# Patient Record
Sex: Female | Born: 1975 | Hispanic: Refuse to answer | Smoking: Never smoker
Health system: Southern US, Community
[De-identification: ages and names within clinical notes are randomized; demographics above are authoritative.]

---

## 2005-12-17 ENCOUNTER — Emergency Department: Payer: Self-pay | Admitting: Emergency Medicine

## 2010-10-21 ENCOUNTER — Encounter: Payer: Self-pay | Admitting: Family Medicine

## 2010-10-21 ENCOUNTER — Ambulatory Visit: Payer: Self-pay | Admitting: Family Medicine

## 2010-10-21 ENCOUNTER — Ambulatory Visit (INDEPENDENT_AMBULATORY_CARE_PROVIDER_SITE_OTHER): Payer: BC Managed Care – PPO | Admitting: Family Medicine

## 2010-10-21 VITALS — BP 112/74 | HR 72 | Ht 60.75 in | Wt 172.0 lb

## 2010-10-21 DIAGNOSIS — Z309 Encounter for contraceptive management, unspecified: Secondary | ICD-10-CM

## 2010-10-21 NOTE — Progress Notes (Signed)
  Subjective:    Patient ID: Susan Bowers, female    DOB: Oct 08, 1975, 35 y.o.   MRN: [id number]  HPI    Review of Systems     Objective:   Physical Exam  Constitutional: She is oriented to person, place, and time. She appears well-developed and well-nourished.  Neck: Normal range of motion. Neck supple.  Cardiovascular: Normal rate, regular rhythm and normal heart sounds.   No murmur heard. Pulmonary/Chest: Effort normal and breath sounds normal.  Musculoskeletal: She exhibits no edema.  Neurological: She is alert and oriented to person, place, and time.  Psychiatric: She has a normal mood and affect. Her behavior is normal. Judgment and thought content normal.          Assessment & Plan:

## 2010-10-21 NOTE — Patient Instructions (Signed)

## 2010-10-21 NOTE — Progress Notes (Signed)
  Subjective:    Patient ID: Susan Bowers, female    DOB: April 07, 1976, 35 y.o.   MRN: [id number]  HPI Pt here to establish only.  No complaints.  Pt is under care of gyn in [place].     Review of Systems No complaints.    Objective:   Physical Exam  Constitutional: She appears well-developed and well-nourished.  Psychiatric: She has a normal mood and affect. Her behavior is normal. Judgment normal.          Assessment & Plan:

## 2010-10-21 NOTE — Assessment & Plan Note (Signed)
rto for fasting labs and cpe

## 2011-09-11 ENCOUNTER — Ambulatory Visit (INDEPENDENT_AMBULATORY_CARE_PROVIDER_SITE_OTHER): Payer: BC Managed Care – PPO | Admitting: Family Medicine

## 2011-09-11 ENCOUNTER — Encounter: Payer: Self-pay | Admitting: Family Medicine

## 2011-09-11 VITALS — BP 120/75 | HR 61 | Temp 98.8°F | Ht 61.5 in | Wt 177.6 lb

## 2011-09-11 DIAGNOSIS — J329 Chronic sinusitis, unspecified: Secondary | ICD-10-CM

## 2011-09-11 MED ORDER — AMOXICILLIN 875 MG PO TABS: 875.0000 mg | ORAL_TABLET | Freq: Two times a day (BID) | ORAL | Status: AC

## 2011-09-11 MED ORDER — BENZONATATE 200 MG PO CAPS: 200.0000 mg | ORAL_CAPSULE | Freq: Three times a day (TID) | ORAL | Status: AC | PRN

## 2011-09-11 NOTE — Progress Notes (Signed)
  Subjective:    Patient ID: Susan Bowers, female    DOB: 04-19-1976, 36 y.o.   MRN: [id number]  HPI URI- sxs started 6 days ago.  Initially thought it was allergies.  Started w/ cough, went to pharmacy on Thursday, recommended Advil Sinus.  Initially had fever- this is gone.  Still w/ nasal congestion, chest congestion, cough- now causing chest to hurt.  Intermittently productive.  Sputum is dark yellow.  Taking Mucinex.  Having facial pain.  No ear pain.  No known sick contacts.   Review of Systems For ROS see HPI     Objective:   Physical Exam  Vitals reviewed. Constitutional: She appears well-developed and well-nourished. No distress.  HENT:  Head: Normocephalic and atraumatic.  Right Ear: Tympanic membrane normal.  Left Ear: Tympanic membrane normal.  Nose: Mucosal edema and rhinorrhea present. Right sinus exhibits maxillary sinus tenderness. Right sinus exhibits no frontal sinus tenderness. Left sinus exhibits maxillary sinus tenderness. Left sinus exhibits no frontal sinus tenderness.  Mouth/Throat: Uvula is midline and mucous membranes are normal. Posterior oropharyngeal erythema present. No oropharyngeal exudate.  Eyes: Conjunctivae and EOM are normal. Pupils are equal, round, and reactive to light.  Neck: Normal range of motion. Neck supple.  Cardiovascular: Normal rate, regular rhythm and normal heart sounds.   Pulmonary/Chest: Effort normal and breath sounds normal. No respiratory distress. She has no wheezes.  Lymphadenopathy:    She has no cervical adenopathy.          Assessment & Plan:

## 2011-09-11 NOTE — Assessment & Plan Note (Signed)
New.  Pt's sxs and PE consistent w/ infxn.  Start abx.  Reviewed supportive care and red flags that should prompt return.  Pt expressed understanding and is in agreement w/ plan.  

## 2011-09-11 NOTE — Patient Instructions (Signed)
This is a sinus infection caused by allergy congestion Start the Amoxicillin twice daily (w/ food) Start Claritin or Zyrtec daily for the allergy component Use the Tessalon as needed for cough- you can combine w/ Delsym as needed Drink plenty of fluids REST! Hang in there!!!

## 2012-05-10 ENCOUNTER — Encounter: Payer: Self-pay | Admitting: Family Medicine

## 2012-05-10 ENCOUNTER — Ambulatory Visit (INDEPENDENT_AMBULATORY_CARE_PROVIDER_SITE_OTHER): Payer: BC Managed Care – PPO | Admitting: Family Medicine

## 2012-05-10 VITALS — BP 114/68 | HR 67 | Temp 98.6°F | Wt 184.2 lb

## 2012-05-10 DIAGNOSIS — L723 Sebaceous cyst: Secondary | ICD-10-CM

## 2012-05-10 DIAGNOSIS — L729 Follicular cyst of the skin and subcutaneous tissue, unspecified: Secondary | ICD-10-CM

## 2012-05-10 NOTE — Progress Notes (Signed)
  Subjective:    Patient ID: Susan Bowers, female    DOB: May 19, 1976, 36 y.o.   MRN: [id number]  HPI Pt here c/o cyst on her scalp that has been there for years.  It has recently gotten bigger and is tender.     Review of Systems    as above Objective:   Physical Exam BP 114/68  Pulse 67  Temp 98.6 F (37 C) (Oral)  Wt 184 lb 3.2 oz (83.553 kg)  SpO2 97% General appearance: alert, cooperative and no distress Head: atraumatic, + 3/4 inch cyst palpated on scalp, tender to touch, no signs of infection,  No drainage       Assessment & Plan:  seb cyst scalp-- refer to surgery secondary to length of time cyst has been present

## 2012-06-10 ENCOUNTER — Encounter (INDEPENDENT_AMBULATORY_CARE_PROVIDER_SITE_OTHER): Payer: Self-pay | Admitting: Surgery

## 2012-06-10 ENCOUNTER — Ambulatory Visit (INDEPENDENT_AMBULATORY_CARE_PROVIDER_SITE_OTHER): Payer: 59 | Admitting: Surgery

## 2012-06-10 VITALS — BP 110/72 | HR 60 | Temp 97.3°F | Resp 14 | Ht 61.0 in | Wt 181.4 lb

## 2012-06-10 DIAGNOSIS — L723 Sebaceous cyst: Secondary | ICD-10-CM

## 2012-06-10 DIAGNOSIS — L720 Epidermal cyst: Secondary | ICD-10-CM

## 2012-06-10 NOTE — Progress Notes (Signed)
Patient ID: Susan Bowers, female   DOB: 07-10-1975, 37 y.o.   MRN: [id number]  No chief complaint on file.   HPI Susan Bowers is a 37 y.o. female.  Patient sent at the request of Dr.Lowe for cyst on scalp. It has been present for many years. It is not draining. It is sore. It has increased in size minimally. HPI  No past medical history on file.  No past surgical history on file.  Family History  Problem Relation Age of Onset  . Alcohol abuse Father   . Liver disease Father   . Diabetes Maternal Aunt     Social History History  Substance Use Topics  . Smoking status: Never Smoker   . Smokeless tobacco: Not on file  . Alcohol Use: 0.5 oz/week    1 drink(s) per week    No Known Allergies  Current Outpatient Prescriptions  Medication Sig Dispense Refill  . Norgestim-Eth Estrad Triphasic (ORTHO TRI-CYCLEN, 28, PO) Take by mouth daily.          Review of Systems Review of Systems  Constitutional: Negative for fever, chills and unexpected weight change.  HENT: Negative for hearing loss, congestion, sore throat, trouble swallowing and voice change.   Eyes: Negative for visual disturbance.  Respiratory: Negative for cough and wheezing.   Cardiovascular: Negative for chest pain, palpitations and leg swelling.  Gastrointestinal: Negative for nausea, vomiting, abdominal pain, diarrhea, constipation, blood in stool, abdominal distention and anal bleeding.  Genitourinary: Negative for hematuria, vaginal bleeding and difficulty urinating.  Musculoskeletal: Negative for arthralgias.  Skin: Negative for rash and wound.  Neurological: Negative for seizures, syncope and headaches.  Hematological: Negative for adenopathy. Does not bruise/bleed easily.  Psychiatric/Behavioral: Negative for confusion.    Blood pressure 110/72, pulse 60, temperature 97.3 F (36.3 C), resp. rate 14, height 5\' 1"  (1.549 m), weight 181 lb 6.4 oz (82.283 kg).  Physical Exam Physical Exam    Constitutional: She is oriented to person, place, and time. She appears well-developed and well-nourished.  HENT:  Head: Normocephalic and atraumatic.       1 x 2 cm cyst scalp.  Not infected or draining  Musculoskeletal: Normal range of motion.  Neurological: She is alert and oriented to person, place, and time.  Skin: Skin is warm and dry.  Psychiatric: She has a normal mood and affect. Her behavior is normal. Judgment and thought content normal.      Assessment    Epidermal inclusion cyst scalp 1 x 2 cm    Plan    Discussed excision versus observation. Risks, benefits and alternative therapies discussed. She has chosen observation for now but will return if he becomes more symptomatic or infected.       Jw Covin A. 06/10/2012, 9:31 AM

## 2012-06-10 NOTE — Patient Instructions (Signed)

## 2012-06-20 ENCOUNTER — Other Ambulatory Visit: Payer: Self-pay | Admitting: Obstetrics and Gynecology

## 2012-06-20 DIAGNOSIS — Z1231 Encounter for screening mammogram for malignant neoplasm of breast: Secondary | ICD-10-CM

## 2012-07-25 ENCOUNTER — Ambulatory Visit: Payer: BC Managed Care – PPO

## 2012-09-30 ENCOUNTER — Encounter: Payer: Self-pay | Admitting: Family Medicine

## 2012-09-30 ENCOUNTER — Ambulatory Visit (INDEPENDENT_AMBULATORY_CARE_PROVIDER_SITE_OTHER): Payer: BC Managed Care – PPO | Admitting: Family Medicine

## 2012-09-30 VITALS — BP 92/76 | HR 89 | Temp 99.7°F | Wt 182.4 lb

## 2012-09-30 DIAGNOSIS — N390 Urinary tract infection, site not specified: Secondary | ICD-10-CM

## 2012-09-30 LAB — POCT URINALYSIS DIPSTICK
Glucose, UA: NEGATIVE
Ketones, UA: NEGATIVE
Spec Grav, UA: 1.02

## 2012-09-30 MED ORDER — CIPROFLOXACIN HCL 250 MG PO TABS: 250.0000 mg | ORAL_TABLET | Freq: Two times a day (BID) | ORAL | Status: DC

## 2012-09-30 NOTE — Patient Instructions (Signed)
Urinary Tract Infection Urinary tract infections (UTIs) can develop anywhere along your urinary tract. Your urinary tract is your body's drainage system for removing wastes and extra water. Your urinary tract includes two kidneys, two ureters, a bladder, and a urethra. Your kidneys are a pair of bean-shaped organs. Each kidney is about the size of your fist. They are located below your ribs, one on each side of your spine. CAUSES Infections are caused by microbes, which are microscopic organisms, including fungi, viruses, and bacteria. These organisms are so small that they can only be seen through a microscope. Bacteria are the microbes that most commonly cause UTIs. SYMPTOMS  Symptoms of UTIs may vary by age and gender of the patient and by the location of the infection. Symptoms in young women typically include a frequent and intense urge to urinate and a painful, burning feeling in the bladder or urethra during urination. Older women and men are more likely to be tired, shaky, and weak and have muscle aches and abdominal pain. A fever may mean the infection is in your kidneys. Other symptoms of a kidney infection include pain in your back or sides below the ribs, nausea, and vomiting. DIAGNOSIS To diagnose a UTI, your caregiver will ask you about your symptoms. Your caregiver also will ask to provide a urine sample. The urine sample will be tested for bacteria and white blood cells. White blood cells are made by your body to help fight infection. TREATMENT  Typically, UTIs can be treated with medication. Because most UTIs are caused by a bacterial infection, they usually can be treated with the use of antibiotics. The choice of antibiotic and length of treatment depend on your symptoms and the type of bacteria causing your infection. HOME CARE INSTRUCTIONS  If you were prescribed antibiotics, take them exactly as your caregiver instructs you. Finish the medication even if you feel better after you  have only taken some of the medication.  Drink enough water and fluids to keep your urine clear or pale yellow.  Avoid caffeine, tea, and carbonated beverages. They tend to irritate your bladder.  Empty your bladder often. Avoid holding urine for long periods of time.  Empty your bladder before and after sexual intercourse.  After a bowel movement, women should cleanse from front to back. Use each tissue only once. SEEK MEDICAL CARE IF:   You have back pain.  You develop a fever.  Your symptoms do not begin to resolve within 3 days. SEEK IMMEDIATE MEDICAL CARE IF:   You have severe back pain or lower abdominal pain.  You develop chills.  You have nausea or vomiting.  You have continued burning or discomfort with urination. MAKE SURE YOU:   Understand these instructions.  Will watch your condition.  Will get help right away if you are not doing well or get worse. Document Released: 02/22/2005 Document Revised: 11/14/2011 Document Reviewed: 06/23/2011 Exit[employer] Patient Information 2013 [employer].  

## 2012-09-30 NOTE — Progress Notes (Signed)
  Subjective:    Susan Bowers is a 37 y.o. female who complains of right flank pain. She has had symptoms for 4 days. Patient also complains of back pain. Patient denies congestion, cough, fever, headache, rhinitis, sorethroat, stomach ache and vaginal discharge. Patient does not have a history of recurrent UTI. Patient does not have a history of pyelonephritis.   The following portions of the patient's history were reviewed and updated as appropriate: allergies, current medications, past family history, past medical history, past social history, past surgical history and problem list.  Review of Systems Pertinent items are noted in HPI.    Objective:    BP 92/76  Pulse 89  Temp(Src) 99.7 F (37.6 C) (Oral)  Wt 182 lb 6.4 oz (82.736 kg)  BMI 34.48 kg/m2  SpO2 99% General appearance: alert, cooperative, appears stated age and no distress Abdomen: soft, non-tender; bowel sounds normal; no masses,  no organomegaly Back --no flank pain  Laboratory:  Urine dipstick: mod for hemoglobin and mod for leukocyte esterase.   Micro exam: not done.    Assessment:    Acute cystitis and UTI     Plan:    Medications: ciprofloxacin. Maintain adequate hydration. Follow up if symptoms not improving, and as needed.

## 2012-10-02 LAB — URINE CULTURE: Colony Count: >=100000

## 2012-10-03 ENCOUNTER — Telehealth: Payer: Self-pay | Admitting: Family Medicine

## 2012-10-03 NOTE — Telephone Encounter (Signed)
Pt called and would like to hear about her labs that she took, pls call pt

## 2012-10-03 NOTE — Telephone Encounter (Signed)
Discuss with patient   Notes Recorded by Lelon Perla, DO on 10/02/2012 at 4:47 PM + UTI-- Treated with cipro

## 2014-12-08 ENCOUNTER — Other Ambulatory Visit: Payer: Self-pay

## 2014-12-08 DIAGNOSIS — Z1231 Encounter for screening mammogram for malignant neoplasm of breast: Secondary | ICD-10-CM

## 2014-12-09 ENCOUNTER — Ambulatory Visit
Admission: RE | Admit: 2014-12-09 | Discharge: 2014-12-09 | Disposition: A | Discharge status: Home / Self Care | Payer: 59 | Source: Ambulatory Visit

## 2014-12-09 DIAGNOSIS — Z1231 Encounter for screening mammogram for malignant neoplasm of breast: Secondary | ICD-10-CM

## 2015-03-11 ENCOUNTER — Ambulatory Visit (INDEPENDENT_AMBULATORY_CARE_PROVIDER_SITE_OTHER): Payer: 59 | Admitting: Family Medicine

## 2015-03-11 ENCOUNTER — Encounter (INDEPENDENT_AMBULATORY_CARE_PROVIDER_SITE_OTHER): Payer: Self-pay

## 2015-03-11 ENCOUNTER — Encounter: Payer: Self-pay | Admitting: Family Medicine

## 2015-03-11 VITALS — BP 114/72 | HR 73 | Temp 98.0°F | Wt 188.6 lb

## 2015-03-11 DIAGNOSIS — R350 Frequency of micturition: Secondary | ICD-10-CM | POA: Diagnosis not present

## 2015-03-11 LAB — POCT URINALYSIS DIPSTICK
Bilirubin, UA: NEGATIVE
Glucose, UA: NEGATIVE
Ketones, UA: NEGATIVE
NITRITE UA: NEGATIVE
PH UA: 6
Spec Grav, UA: 1.025
UROBILINOGEN UA: 1

## 2015-03-11 MED ORDER — CIPROFLOXACIN HCL 250 MG PO TABS: 250.0000 mg | ORAL_TABLET | Freq: Two times a day (BID) | ORAL | Status: DC

## 2015-03-11 NOTE — Patient Instructions (Signed)

## 2015-03-11 NOTE — Progress Notes (Signed)
Pre visit review using our clinic review tool, if applicable. No additional management support is needed unless otherwise documented below in the visit note. 

## 2015-03-11 NOTE — Progress Notes (Signed)
  ZOX:[id number]:Susan Lowne, DO Chief Complaint  Patient presents with  . Nocturia    c/o urine frequency x's 6 days  . Gas    x's 2 weeks    Current Issues:  Presents with few  days of dysuria, urinary urgency and urinary frequency Associated symptoms include:  dysuria and urinary frequency  There is no history of of similar symptoms. Sexually active:  Yes with female.   No concern for STI.  Prior to Admission medications   Medication Sig Start Date End Date Taking? Authorizing Provider  ORTHO TRI-CYCLEN, 28, 0.18/0.215/0.25 MG-35 MCG tablet Take 1 tablet by mouth daily. 02/27/15  Yes Historical Provider, MD  ciprofloxacin (CIPRO) 250 MG tablet Take 1 tablet (250 mg total) by mouth 2 (two) times daily. 03/11/15   [name], DO    Review of Systems:see above  PE:  BP 114/72 mmHg  Pulse 73  Temp(Src) 98 F (36.7 C) (Oral)  Wt 188 lb 9.6 oz (85.548 kg)  SpO2 99%  LMP 02/24/2015 Constitutional--no fever/ chills Heart--s1s2 Lungs-- ctab/l -- no rrw Back- no flank pain Abdomen--- soft non tender   Results for orders placed or performed in visit on 03/11/15  POCT Urinalysis Dipstick  Result Value Ref Range   Color, UA Yellow    Clarity, UA Cloudy    Glucose, UA Neg    Bilirubin, UA Neg    Ketones, UA Neg    Spec Grav, UA 1.025    Blood, UA Small    pH, UA 6.0    Protein, UA Moderate    Urobilinogen, UA 1.0    Nitrite, UA Neg    Leukocytes, UA moderate (2+) (A) Negative    Assessment and Plan:  1. Urine frequency Check culture Drink plenty of water Can use azo as directed cipro x 3 days - POCT Urinalysis Dipstick - Urine Culture - ciprofloxacin (CIPRO) 250 MG tablet; Take 1 tablet (250 mg total) by mouth 2 (two) times daily.  Dispense: 6 tablet; Refill: 0

## 2015-03-12 LAB — URINE CULTURE: Colony Count: 30000

## 2015-08-24 ENCOUNTER — Telehealth: Payer: Self-pay | Admitting: Family Medicine

## 2015-08-24 NOTE — Telephone Encounter (Signed)
VM from pt 3/28 9:18am stating name, DOB, and ph # with no message - called pt no answer, VM not setup

## 2016-01-16 IMAGING — MG MM SCREEN MAMMOGRAM BILATERAL
4 series · 4 of 4 positions shown · non-contrast
Comparison: None.

CLINICAL DATA: Screening.

EXAM:
DIGITAL SCREENING BILATERAL MAMMOGRAM WITH CAD

[R CC]
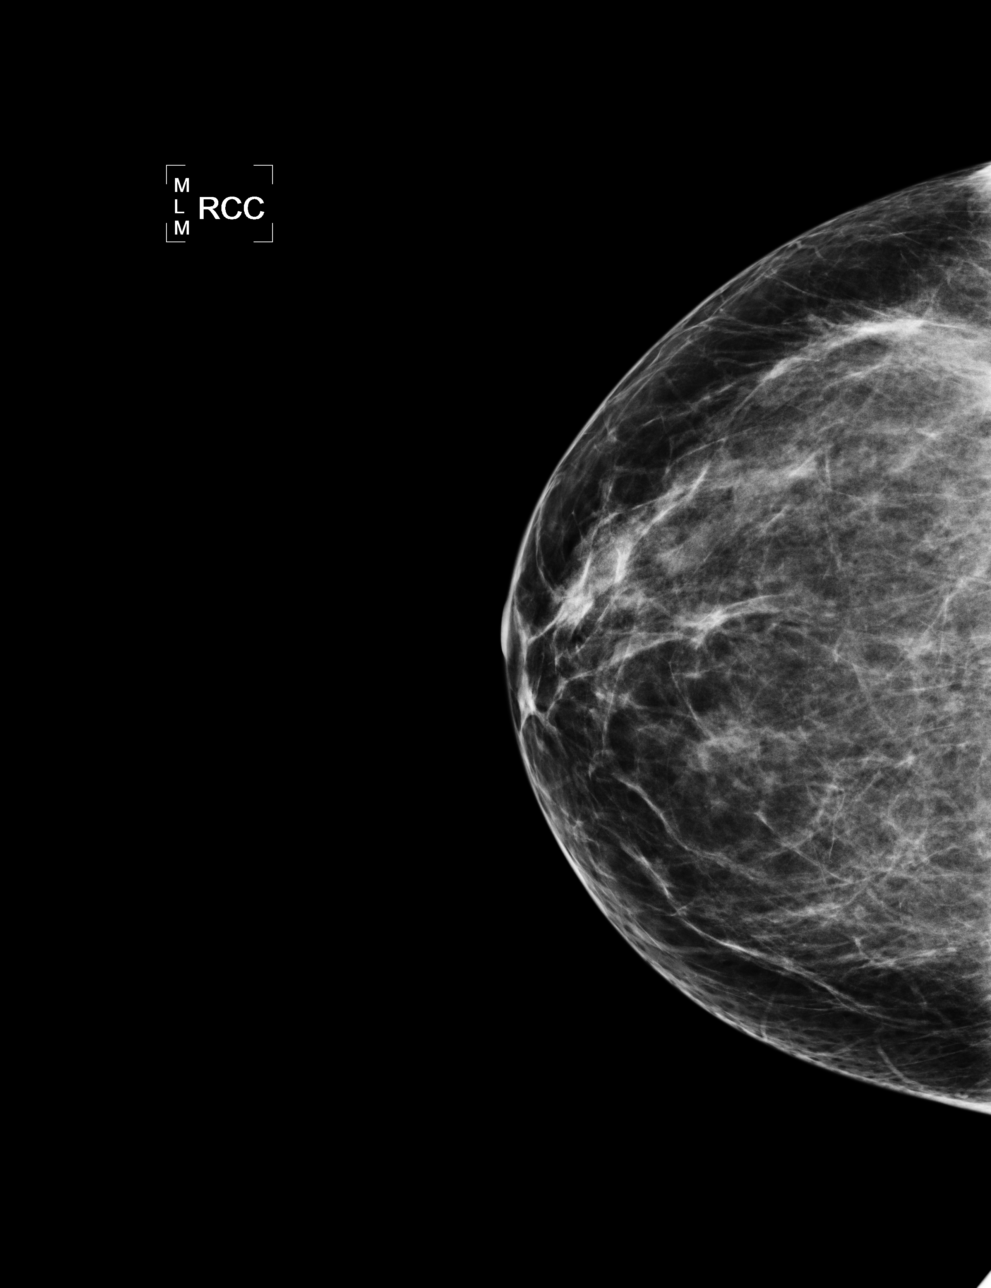

[L CC]
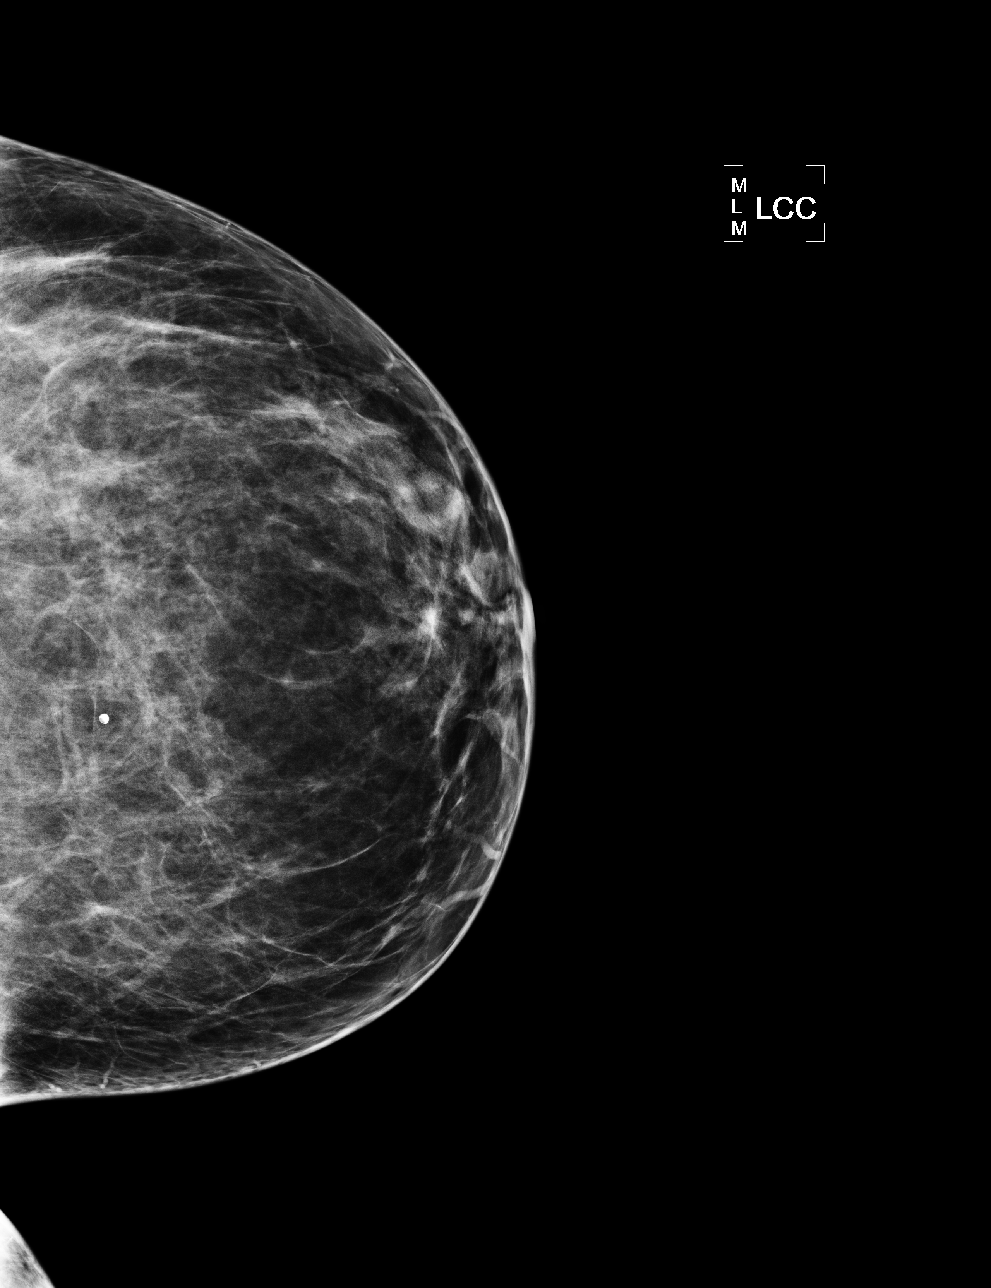

[L MLO]
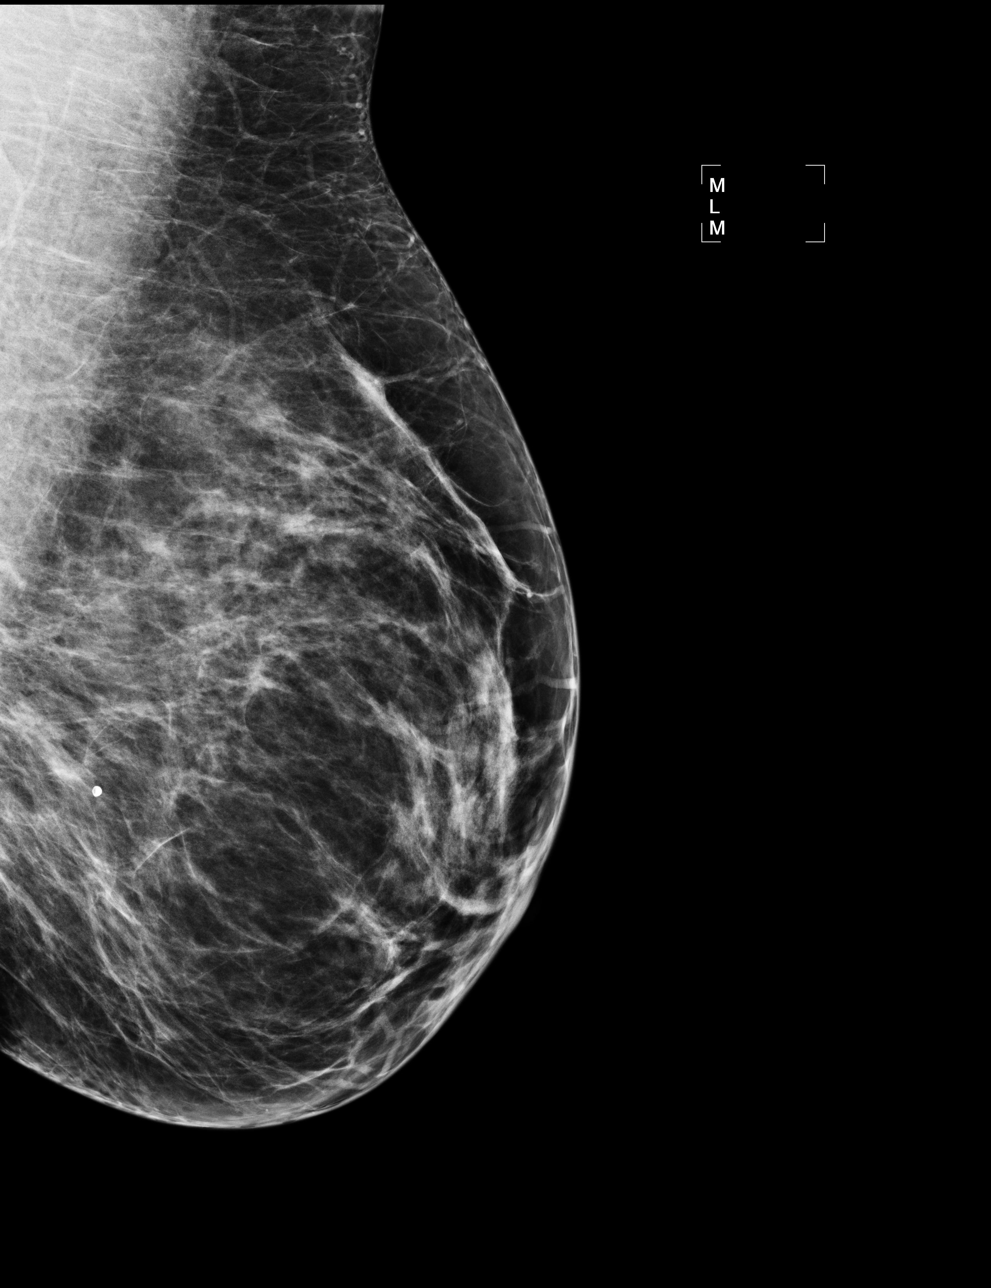

[R MLO]
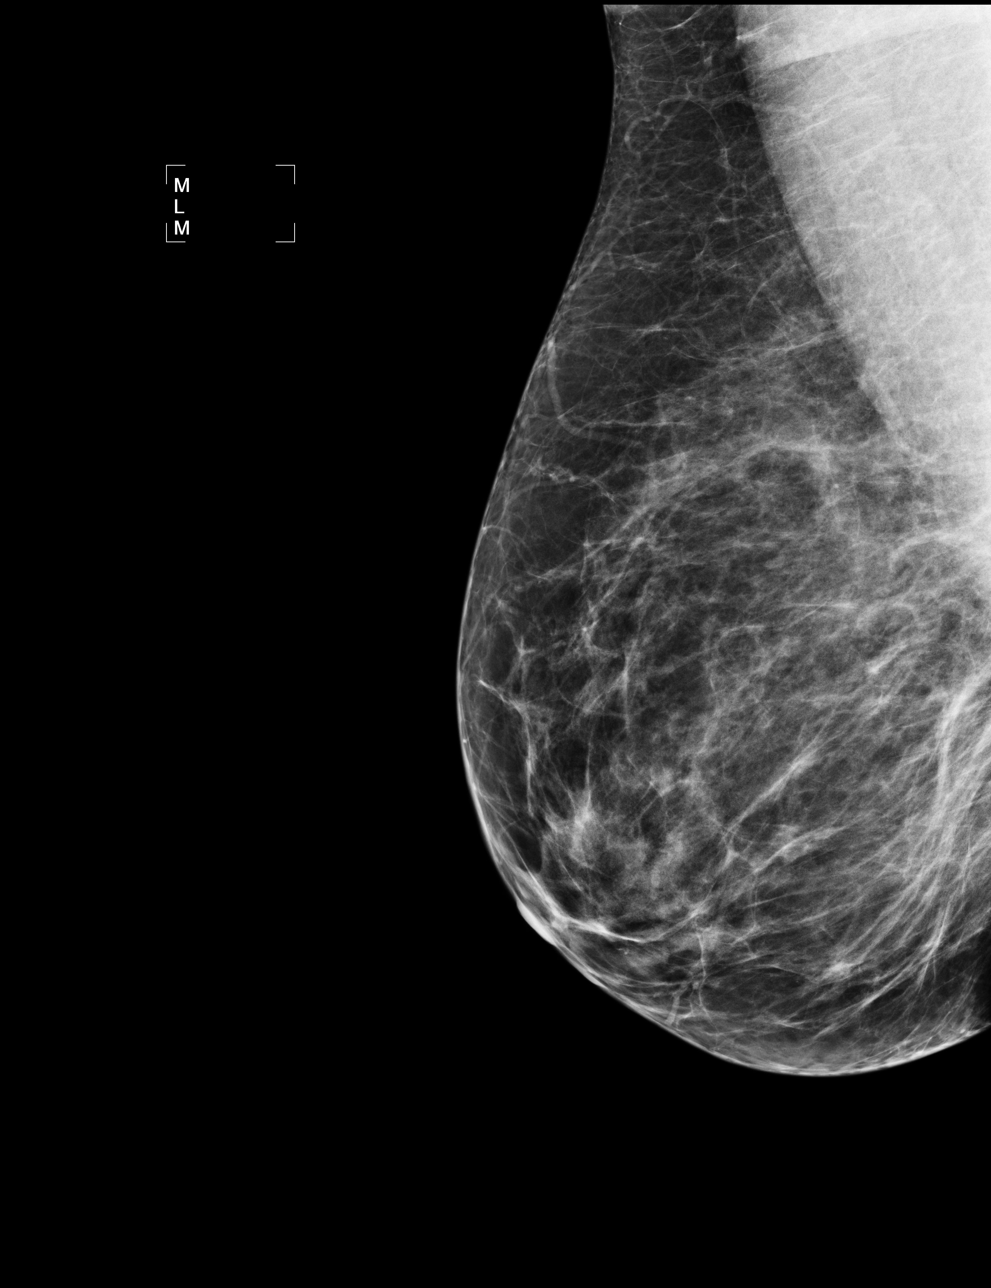

[4 of 4 positions shown; findings below may reference images not displayed]

ACR Breast Density Category b: There are scattered areas of
fibroglandular density.
FINDINGS: There are no findings suspicious for malignancy. Images were
processed with CAD.
IMPRESSION: No mammographic evidence of malignancy. A result letter of this
screening mammogram will be mailed directly to the patient.

RECOMMENDATION:
Screening mammogram in one year. (Code:[id number]-[id number])

BI-RADS CATEGORY  1: Negative.

## 2016-12-13 DIAGNOSIS — Z6839 Body mass index (BMI) 39.0-39.9, adult: Secondary | ICD-10-CM | POA: Diagnosis not present

## 2016-12-13 DIAGNOSIS — Z30011 Encounter for initial prescription of contraceptive pills: Secondary | ICD-10-CM | POA: Diagnosis not present

## 2016-12-13 DIAGNOSIS — E669 Obesity, unspecified: Secondary | ICD-10-CM | POA: Diagnosis not present

## 2016-12-13 DIAGNOSIS — Z30432 Encounter for removal of intrauterine contraceptive device: Secondary | ICD-10-CM | POA: Diagnosis not present

## 2017-05-17 ENCOUNTER — Ambulatory Visit: Payer: BLUE CROSS/BLUE SHIELD | Admitting: Family Medicine

## 2017-05-17 ENCOUNTER — Encounter: Payer: Self-pay | Admitting: Family Medicine

## 2017-05-17 VITALS — BP 120/60 | HR 92 | Temp 98.9°F | Resp 16 | Ht 61.0 in | Wt 199.8 lb

## 2017-05-17 DIAGNOSIS — L02415 Cutaneous abscess of right lower limb: Secondary | ICD-10-CM | POA: Diagnosis not present

## 2017-05-17 MED ORDER — AMOXICILLIN-POT CLAVULANATE 875-125 MG PO TABS: 1.0000 | ORAL_TABLET | Freq: Two times a day (BID) | ORAL | 0 refills | Status: DC

## 2017-05-17 MED ORDER — FLUCONAZOLE 150 MG PO TABS
150.0000 mg | ORAL_TABLET | Freq: Once | ORAL | 0 refills | Status: AC
Start: 2017-05-17 — End: 2017-05-17

## 2017-05-17 MED ORDER — CEFTRIAXONE SODIUM 1 G IJ SOLR
1.0000 g | Freq: Once | INTRAMUSCULAR | Status: AC
  Administered 2017-05-17: 1 g via INTRAMUSCULAR

## 2017-05-17 MED ORDER — TRAMADOL HCL 50 MG PO TABS: 50.0000 mg | ORAL_TABLET | Freq: Three times a day (TID) | ORAL | 0 refills | Status: DC | PRN

## 2017-05-17 MED FILL — AMOX-CLAV 875-125 MG TABLET: 875-125 | 10 days supply | Qty: 20 | Fill #0

## 2017-05-17 MED FILL — FLUCONAZOLE 150 MG TABLET: 150 | 6 days supply | Qty: 2 | Fill #0

## 2017-05-17 MED FILL — traMADol HCL 50 MG TABS: 50 | 7 days supply | Qty: 21 | Fill #0

## 2017-05-17 NOTE — Patient Instructions (Signed)

## 2017-05-17 NOTE — Progress Notes (Signed)
Patient ID: Susan Bowers, female   DOB: April 02, 1976, 41 y.o.   MRN: [id number]     Subjective:  I acted as a Education administrator for Dr. Carollee Herter.  Susan Bowers, Susan Bowers   Patient ID: Susan Bowers, female    DOB: 07-Sep-1975, 41 y.o.   MRN: [id number]  Chief Complaint  Patient presents with  . Edema    back of right thigh    HPI  Patient is in today for swelling back of right thigh.  Think she may have been bitten on Tuesday or Wednesday.  It started out with a little itch and now it is swollen.   She has used alcohol for the itching.  Patient Care Team: Carollee Herter, Alferd Apa, DO as PCP - General (Family Medicine)   No past medical history on file.  No past surgical history on file.  Family History  Problem Relation Age of Onset  . Alcohol abuse Father   . Liver disease Father   . Diabetes Maternal Aunt     Social History   Socioeconomic History  . Marital status: Divorced    Spouse name: Not on file  . Number of children: 1  . Years of education: Not on file  . Highest education level: Not on file  Social Needs  . Financial resource strain: Not on file  . Food insecurity - worry: Not on file  . Food insecurity - inability: Not on file  . Transportation needs - medical: Not on file  . Transportation needs - non-medical: Not on file  Occupational History  . 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 Smoking status: Never Smoker  . Smokeless tobacco: Never Used  Substance and Sexual Activity  . Alcohol use: Yes    Alcohol/week: 0.5 oz    Types: 1 Standard drinks or equivalent per week  . Drug use: Not on file  . Sexual activity: Not Currently    Partners: Male    Birth control/protection: Pill  Other Topics Concern  . Not on file  Social History Narrative  . Not on file    Outpatient Medications Prior to Visit  Medication Sig Dispense Refill  . ORTHO TRI-CYCLEN, 28, 0.18/0.215/0.25 MG-35 MCG tablet Take 1 tablet by mouth daily.  10    . ciprofloxacin (CIPRO) 250 MG tablet Take 1 tablet (250 mg total) by mouth 2 (two) times daily. 6 tablet 0   No facility-administered medications prior to visit.     No Known Allergies  Review of Systems  Constitutional: Negative for fever and malaise/fatigue.  HENT: Negative for congestion.   Eyes: Negative for blurred vision.  Respiratory: Negative for cough and shortness of breath.   Cardiovascular: Negative for chest pain, palpitations and leg swelling.  Gastrointestinal: Negative for vomiting.  Musculoskeletal: Negative for back pain.  Skin: Positive for itching (back of right thigh) and rash (back of right thigh).  Neurological: Negative for loss of consciousness and headaches.       Objective:    Physical Exam  Constitutional: She is oriented to person, place, and time. She appears well-developed and well-nourished.  HENT:  Head: Normocephalic and atraumatic.  Eyes: Conjunctivae and EOM are normal.  Neck: Normal range of motion. Neck supple. No JVD present. Carotid bruit is not present. No thyromegaly present.  Cardiovascular: Normal rate, regular rhythm and normal heart sounds.  No murmur heard. Pulmonary/Chest: Effort normal and breath sounds normal. No respiratory distress. She has no wheezes. She has no rales. She  exhibits no tenderness.  Musculoskeletal: She exhibits no edema.  Neurological: She is alert and oriented to person, place, and time.  Skin:     Psychiatric: She has a normal mood and affect.  Nursing note and vitals reviewed.   BP 120/60 (BP Location: Right Arm, Cuff Size: Large)   Pulse 92   Temp 98.9 F (37.2 C) (Oral)   Resp 16   Ht 5' 1" (1.549 m)   Wt 199 lb 12.8 oz (90.6 kg)   LMP 05/10/2017   SpO2 95%   BMI 37.75 kg/m  Wt Readings from Last 3 Encounters:  05/17/17 199 lb 12.8 oz (90.6 kg)  03/11/15 188 lb 9.6 oz (85.5 kg)  09/30/12 182 lb 6.4 oz (82.7 kg)   BP Readings from Last 3 Encounters:  05/17/17 120/60  03/11/15 114/72   09/30/12 92/76      There is no immunization history on file for this patient.  Health Maintenance  Topic Date Due  . HIV Screening  09/29/1989  . TETANUS/TDAP  09/29/1993  . INFLUENZA VACCINE  12/27/2016  . PAP SMEAR  04/28/2017    No results found for: WBC, HGB, HCT, PLT, GLUCOSE, CHOL, TRIG, HDL, LDLDIRECT, LDLCALC, ALT, AST, NA, K, CL, CREATININE, BUN, CO2, TSH, PSA, INR, GLUF, HGBA1C, MICROALBUR  No results found for: TSH No results found for: WBC, HGB, HCT, MCV, PLT No results found for: NA, K, CHLORIDE, CO2, GLUCOSE, BUN, CREATININE, BILITOT, ALKPHOS, AST, ALT, PROT, ALBUMIN, CALCIUM, ANIONGAP, EGFR, GFR No results found for: CHOL No results found for: HDL No results found for: LDLCALC No results found for: TRIG No results found for: CHOLHDL No results found for: HGBA1C       Assessment & Plan:   Problem List Items Addressed This Visit      Unprioritized   Abscess of right thigh - Primary    Rocephin IM Augmentin x 10 days epson salt soaks If no better or worsens will refer to surgery--- if over weekend -- go to ER      Relevant Medications   amoxicillin-clavulanate (AUGMENTIN) 875-125 MG tablet   traMADol (ULTRAM) 50 MG tablet   cefTRIAXone (ROCEPHIN) injection 1 g (Completed)      I have discontinued Susan Bowers's ciprofloxacin. I am also having her start on amoxicillin-clavulanate, fluconazole, and traMADol. Additionally, I am having her maintain her ORTHO TRI-CYCLEN (28). We administered cefTRIAXone.  Meds ordered this encounter  Medications  . amoxicillin-clavulanate (AUGMENTIN) 875-125 MG tablet    Sig: Take 1 tablet by mouth 2 (two) times daily.    Dispense:  20 tablet    Refill:  0  . fluconazole (DIFLUCAN) 150 MG tablet    Sig: Take 1 tablet (150 mg total) by mouth once for 1 dose. May repeat in 3 days prn    Dispense:  2 tablet    Refill:  0  . traMADol (ULTRAM) 50 MG tablet    Sig: Take 1 tablet (50 mg total) by mouth every 8  (eight) hours as needed.    Dispense:  30 tablet    Refill:  0  . cefTRIAXone (ROCEPHIN) injection 1 g    CMA served as scribe during this visit. History, Physical and Plan performed by medical provider. Documentation and orders reviewed and attested to.  Ann Held, DO

## 2017-05-17 NOTE — Assessment & Plan Note (Signed)
Rocephin IM Augmentin x 10 days epson salt soaks If no better or worsens will refer to surgery--- if over weekend -- go to ER

## 2017-05-18 ENCOUNTER — Encounter: Payer: Self-pay | Admitting: Family Medicine

## 2017-05-18 ENCOUNTER — Other Ambulatory Visit: Payer: Self-pay | Admitting: Family Medicine

## 2017-05-18 ENCOUNTER — Telehealth: Payer: Self-pay | Admitting: Family Medicine

## 2017-05-18 ENCOUNTER — Other Ambulatory Visit: Payer: Self-pay

## 2017-05-18 DIAGNOSIS — L0291 Cutaneous abscess, unspecified: Secondary | ICD-10-CM

## 2017-05-18 DIAGNOSIS — L02415 Cutaneous abscess of right lower limb: Secondary | ICD-10-CM

## 2017-05-18 NOTE — Telephone Encounter (Signed)
CRM for notification. See Telephone encounter for:   05/18/17.  Relation to WU:JWJXpt:self Call back number: [phone]   Reason for call:  Patient was seen 05/17/17 and states PCP advised if symptoms didn't improve to contact office and the next step would be a "abscess procedure" patient unaware of what type of procedure PCP was recommending, patient would like to discuss please advise

## 2017-05-18 NOTE — Telephone Encounter (Signed)
She needs asap app with [facility] advisers[facility]geon

## 2017-05-18 NOTE — Telephone Encounter (Signed)
Ref placed in system and Pt called

## 2017-06-04 ENCOUNTER — Telehealth: Payer: Self-pay | Admitting: *Deleted

## 2017-06-04 NOTE — Telephone Encounter (Signed)
Can you schedule this patient.  She has to be seen.  Not sure why [facility] did not schedule this.

## 2017-06-04 NOTE — Telephone Encounter (Signed)
Copied from CRM [id number]. Topic: Inquiry >> Jun 04, 2017  9:53 AM Raquel SarnaHayes, Teresa G wrote: Pt needs a school health questionnaire filled out by Dr. Laury AxonLowne and a TB skin test.   Please call pt back to let her know what she will needs to do to get this done.

## 2017-06-05 NOTE — Telephone Encounter (Signed)
Call patient and left message to call back and make an appointment per note below.

## 2017-06-18 ENCOUNTER — Encounter: Payer: Self-pay | Admitting: Family Medicine

## 2017-06-18 ENCOUNTER — Ambulatory Visit: Payer: BLUE CROSS/BLUE SHIELD | Admitting: Family Medicine

## 2017-06-18 VITALS — BP 110/70 | HR 86 | Temp 98.6°F | Resp 16 | Ht 61.0 in | Wt 202.0 lb

## 2017-06-18 DIAGNOSIS — Z23 Encounter for immunization: Secondary | ICD-10-CM | POA: Diagnosis not present

## 2017-06-18 MED ORDER — TETANUS-DIPHTH-ACELL PERTUSSIS 5-2.5-18.5 LF-MCG/0.5 IM SUSP
0.5000 mL | Freq: Once | INTRAMUSCULAR | Status: AC
  Administered 2017-06-18: 0.5 mL via INTRAMUSCULAR

## 2017-06-18 NOTE — Patient Instructions (Signed)
We will check titres for MMR and hep b in the blood today You received your tdap We will also do the TB gold test

## 2017-06-18 NOTE — Progress Notes (Signed)
Patient ID: Susan Bowers, female    DOB: 16-Feb-1976  Age: 42 y.o. 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   Review of Systems  Constitutional: Negative for appetite change, diaphoresis, fatigue and unexpected weight change.  Eyes: Negative for pain, redness and visual disturbance.  Respiratory: Negative for cough, chest tightness, shortness of breath and wheezing.   Cardiovascular: Negative for chest pain, palpitations and leg swelling.  Endocrine: Negative for cold intolerance, heat intolerance, polydipsia, polyphagia and polyuria.  Genitourinary: Negative for difficulty urinating, dysuria and frequency.  Neurological: Negative for dizziness, light-headedness, numbness and headaches.    History No past medical history on file.  She has no past surgical history on file.   Her family history includes Alcohol abuse in her father; Diabetes in her maternal aunt; Liver disease in her father.She reports that  has never smoked. she has never used smokeless tobacco. She reports that she drinks about 0.5 oz of alcohol per week. Her drug history is not on file.  Current Outpatient Medications on File Prior to Visit  Medication Sig Dispense Refill  . ORTHO TRI-CYCLEN, 28, 0.18/0.215/0.25 MG-35 MCG tablet Take 1 tablet by mouth daily.  10  . traMADol (ULTRAM) 50 MG tablet Take 1 tablet (50 mg total) by mouth every 8 (eight) hours as needed. 30 tablet 0   No current facility-administered medications on file prior to visit.      Objective:  Objective  Physical Exam  Constitutional: She is oriented to person, place, and time. She appears well-developed and well-nourished.  HENT:  Head: Normocephalic and atraumatic.  Eyes: Conjunctivae and EOM are normal.  Neck: Normal range of motion. Neck supple. No JVD present. Carotid bruit is not present. No thyromegaly present.  Cardiovascular: Normal  rate, regular rhythm and normal heart sounds.  No murmur heard. Pulmonary/Chest: Effort normal and breath sounds normal. No respiratory distress. She has no wheezes. She has no rales. She exhibits no tenderness.  Musculoskeletal: She exhibits no edema.  Neurological: She is alert and oriented to person, place, and time.  Psychiatric: She has a normal mood and affect.  Nursing note and vitals reviewed.  BP 110/70 (BP Location: Left Arm, Cuff Size: Normal)   Pulse 86   Temp 98.6 F (37 C) (Oral)   Resp 16   Ht 5' 1"  (1.549 m)   Wt 202 lb (91.6 kg)   SpO2 98%   BMI 38.17 kg/m  Wt Readings from Last 3 Encounters:  06/18/17 202 lb (91.6 kg)  05/17/17 199 lb 12.8 oz (90.6 kg)  03/11/15 188 lb 9.6 oz (85.5 kg)     No results found for: WBC, HGB, HCT, PLT, GLUCOSE, CHOL, TRIG, HDL, LDLDIRECT, LDLCALC, ALT, AST, NA, K, CL, CREATININE, BUN, CO2, TSH, PSA, INR, GLUF, HGBA1C, MICROALBUR  Mm Digital Screening Bilateral  Result Date: 12/09/2014 CLINICAL DATA:  Screening. EXAM: DIGITAL SCREENING BILATERAL MAMMOGRAM WITH CAD COMPARISON:  None. ACR Breast Density Category b: There are scattered areas of fibroglandular density. FINDINGS: There are no findings suspicious for malignancy. Images were processed with CAD. IMPRESSION: No mammographic evidence of malignancy. A result letter of this screening mammogram will be mailed directly to the patient. RECOMMENDATION: Screening mammogram in one year. (Code:SM-B-01Y) BI-RADS CATEGORY  1: Negative. Electronically Signed   By: Fidela Salisbury M.D.   On: 12/10/2014 12:08     Assessment & Plan:  Plan  I have  discontinued Heavin S. Hughson's amoxicillin-clavulanate. I am also having her maintain her ORTHO TRI-CYCLEN (28) and traMADol. We administered Tdap.  Meds ordered this encounter  Medications  . Tdap (BOOSTRIX) injection 0.5 mL    Problem List Items Addressed This Visit    None    Visit Diagnoses    Need for tuberculosis vaccination    -   Primary   Relevant Orders   QuantiFERON-TB Gold Plus   Need for MMR vaccine       Relevant Orders   Measles/Mumps/Rubella Immunity   Need for hepatitis B booster vaccination       Relevant Orders   Hepatitis B surface antibody   Need for Tdap vaccination       Relevant Medications   Tdap (BOOSTRIX) injection 0.5 mL (Completed)      Follow-up: Return if symptoms worsen or fail to improve.  Ann Held, DO

## 2017-06-20 LAB — QUANTIFERON-TB GOLD PLUS
MITOGEN-NIL: 7.88 [IU]/mL
NIL: 0.04 [IU]/mL
QUANTIFERON-TB GOLD PLUS: NEGATIVE
TB1-NIL: 0 [IU]/mL
TB2-NIL: 0.01 IU/mL

## 2017-06-20 LAB — MEASLES/MUMPS/RUBELLA IMMUNITY
Mumps IgG: 28.8 AU/mL
Rubella: 1.79 index
Rubeola IgG: 177 AU/mL

## 2017-06-20 LAB — HEPATITIS B SURFACE ANTIBODY, QUANTITATIVE: Hepatitis B-Post: <5 m[IU]/mL — ABNORMAL LOW (ref >=10)

## 2017-06-21 ENCOUNTER — Encounter: Payer: Self-pay | Admitting: *Deleted

## 2018-02-26 DIAGNOSIS — Z1239 Encounter for other screening for malignant neoplasm of breast: Secondary | ICD-10-CM | POA: Diagnosis not present

## 2018-02-26 DIAGNOSIS — Z01419 Encounter for gynecological examination (general) (routine) without abnormal findings: Secondary | ICD-10-CM | POA: Diagnosis not present

## 2018-02-26 DIAGNOSIS — N760 Acute vaginitis: Secondary | ICD-10-CM | POA: Diagnosis not present

## 2018-02-26 DIAGNOSIS — N852 Hypertrophy of uterus: Secondary | ICD-10-CM | POA: Diagnosis not present

## 2018-02-26 DIAGNOSIS — Z124 Encounter for screening for malignant neoplasm of cervix: Secondary | ICD-10-CM | POA: Diagnosis not present

## 2018-02-26 DIAGNOSIS — R87610 Atypical squamous cells of undetermined significance on cytologic smear of cervix (ASC-US): Secondary | ICD-10-CM | POA: Diagnosis not present

## 2018-02-26 DIAGNOSIS — N898 Other specified noninflammatory disorders of vagina: Secondary | ICD-10-CM | POA: Diagnosis not present

## 2019-01-17 ENCOUNTER — Ambulatory Visit (INDEPENDENT_AMBULATORY_CARE_PROVIDER_SITE_OTHER): Payer: 59 | Admitting: Family Medicine

## 2019-01-17 ENCOUNTER — Other Ambulatory Visit: Payer: Self-pay

## 2019-01-17 NOTE — Progress Notes (Signed)
Virtual Visit via Video Note  I connected with Susan Bowers on 01/17/19 at  3:20 PM EDT by a video enabled telemedicine application and verified that I am speaking with the correct person using two identifiers.  Location: Patient: home  Provider: offfice    I discussed the limitations of evaluation and management by telemedicine and the availability of in person appointments. The patient expressed understanding and agreed to proceed.  History of Present Illness: Pt is home c/o struggling with weight loss.   She is requesting a referral to a nutritionist.   No other complaints.   Observations/Objective: No vitals obtained Pt in NAD   Assessment and Plan: 1. Morbid obesity ([place]) Refer to healthy weight and wellness  - Amb Ref to Medical Weight Management  Follow Up Instructions:    I discussed the assessment and treatment plan with the patient. The patient was provided an opportunity to ask questions and all were answered. The patient agreed with the plan and demonstrated an understanding of the instructions.   The patient was advised to call back or seek an in-person evaluation if the symptoms worsen or if the condition fails to improve as anticipated.  I provided 15 minutes of non-face-to-face time during this encounter.   Ann Held, DO

## 2019-01-19 ENCOUNTER — Encounter: Payer: Self-pay | Admitting: Family Medicine

## 2019-01-23 ENCOUNTER — Encounter: Payer: Self-pay | Admitting: Family Medicine

## 2019-01-29 ENCOUNTER — Telehealth: Payer: Self-pay | Admitting: *Deleted

## 2019-01-29 NOTE — Telephone Encounter (Signed)
Heello, hope all is well.  I have not been contacted by the clinic i was referred to. Can you send a referral to Dr. Nancy Fetter at [facility] on [address]  Dr Sandi Mariscal [address], [place], [place] [address] Fax: [phone]  Thanks Susan Bowers

## 2019-01-29 NOTE — Telephone Encounter (Signed)
Patient sent this message through mychart.  She is talking about referral for wt loss clinic.  Can you send her referral here or do you need me to put in a new referral?

## 2019-04-07 ENCOUNTER — Ambulatory Visit (INDEPENDENT_AMBULATORY_CARE_PROVIDER_SITE_OTHER): Payer: No Typology Code available for payment source | Admitting: Family Medicine

## 2019-04-07 ENCOUNTER — Other Ambulatory Visit: Payer: Self-pay

## 2019-04-07 ENCOUNTER — Encounter: Payer: Self-pay | Admitting: Family Medicine

## 2019-04-07 VITALS — BP 110/60 | HR 75 | Temp 97.3°F | Resp 18 | Ht 61.0 in | Wt 185.8 lb

## 2019-04-07 DIAGNOSIS — Z23 Encounter for immunization: Secondary | ICD-10-CM | POA: Diagnosis not present

## 2019-04-07 DIAGNOSIS — M79604 Pain in right leg: Secondary | ICD-10-CM

## 2019-04-07 NOTE — Progress Notes (Signed)
Patient ID: Susan Bowers, female    DOB: 1975/09/11  Age: 43 y.o. MRN: [id number]    Subjective:  Subjective  HPI LAASIA ARCOS presents for R leg pain x years   It started with her knee and now its her entire leg .   No weakness per pt  No known back injury  Review of Systems  Constitutional: Negative for appetite change, diaphoresis, fatigue and unexpected weight change.  Eyes: Negative for pain, redness and visual disturbance.  Respiratory: Negative for cough, chest tightness, shortness of breath and wheezing.   Cardiovascular: Negative for chest pain, palpitations and leg swelling.  Endocrine: Negative for cold intolerance, heat intolerance, polydipsia, polyphagia and polyuria.  Genitourinary: Negative for difficulty urinating, dysuria and frequency.  Musculoskeletal: Positive for arthralgias and back pain.  Neurological: Negative for dizziness, light-headedness, numbness and headaches.    History No past medical history on file.  She has no past surgical history on file.   Her family history includes Alcohol abuse in her father; Diabetes in her maternal aunt; Liver disease in her father.She reports that she has never smoked. She has never used smokeless tobacco. She reports current alcohol use of about 1.0 standard drinks of alcohol per week. No history on file for drug.  Current Outpatient Medications on File Prior to Visit  Medication Sig Dispense Refill  . ORTHO TRI-CYCLEN, 28, 0.18/0.215/0.25 MG-35 MCG tablet Take 1 tablet by mouth daily.  10  . phentermine (ADIPEX-P) 37.5 MG tablet TK 1 T PO D    . traMADol (ULTRAM) 50 MG tablet Take 1 tablet (50 mg total) by mouth every 8 (eight) hours as needed. (Patient not taking: Reported on 04/07/2019) 30 tablet 0   No current facility-administered medications on file prior to visit.      Objective:  Objective  Physical Exam Vitals signs and nursing note reviewed.  Constitutional:      Appearance: She is well-developed.   HENT:     Head: Normocephalic and atraumatic.  Eyes:     Conjunctiva/sclera: Conjunctivae normal.  Neck:     Musculoskeletal: Normal range of motion and neck supple.     Thyroid: No thyromegaly.     Vascular: No carotid bruit or JVD.  Cardiovascular:     Rate and Rhythm: Normal rate and regular rhythm.     Heart sounds: Normal heart sounds. No murmur.  Pulmonary:     Effort: Pulmonary effort is normal. No respiratory distress.     Breath sounds: Normal breath sounds. No wheezing or rales.  Chest:     Chest wall: No tenderness.  Musculoskeletal: Normal range of motion.        General: No swelling, tenderness, deformity or signs of injury.     Right lower leg: No edema.     Left lower leg: No edema.  Neurological:     Mental Status: She is alert and oriented to person, place, and time.     Sensory: No sensory deficit.     Motor: No weakness.     Coordination: Coordination normal.     Gait: Gait normal.     Deep Tendon Reflexes: Reflexes normal.    BP 110/60 (BP Location: Right Arm, Patient Position: Sitting, Cuff Size: Large)   Pulse 75   Temp (!) 97.3 F (36.3 C) (Temporal)   Resp 18   Ht 5\' 1"  (1.549 m)   Wt 185 lb 12.8 oz (84.3 kg)   SpO2 98%   BMI 35.11 kg/m  Wt  Readings from Last 3 Encounters:  04/07/19 185 lb 12.8 oz (84.3 kg)  06/18/17 202 lb (91.6 kg)  05/17/17 199 lb 12.8 oz (90.6 kg)     No results found for: WBC, HGB, HCT, PLT, GLUCOSE, CHOL, TRIG, HDL, LDLDIRECT, LDLCALC, ALT, AST, NA, K, CL, CREATININE, BUN, CO2, TSH, PSA, INR, GLUF, HGBA1C, MICROALBUR  Mm Digital Screening Bilateral  Result Date: 12/09/2014 CLINICAL DATA:  Screening. EXAM: DIGITAL SCREENING BILATERAL MAMMOGRAM WITH CAD COMPARISON:  None. ACR Breast Density Category b: There are scattered areas of fibroglandular density. FINDINGS: There are no findings suspicious for malignancy. Images were processed with CAD. IMPRESSION: No mammographic evidence of malignancy. A result letter of this  screening mammogram will be mailed directly to the patient. RECOMMENDATION: Screening mammogram in one year. (Code:SM-B-01Y) BI-RADS CATEGORY  1: Negative. Electronically Signed   By: Fidela Salisbury M.D.   On: 12/10/2014 12:08     Assessment & Plan:  Plan  I am having Nanami S. Hornik maintain her Ortho Tri-Cyclen (28), traMADol, and phentermine.  No orders of the defined types were placed in this encounter.   Problem List Items Addressed This Visit    None    Visit Diagnoses    Right leg pain    -  Primary   Need for influenza vaccination       Relevant Orders   Flu Vaccine QUAD 6+ mos PF IM (Fluarix Quad PF) (Completed)      Follow-up: Return if symptoms worsen or fail to improve.  Ann Held, DO

## 2019-04-08 DIAGNOSIS — M79604 Pain in right leg: Secondary | ICD-10-CM | POA: Insufficient documentation

## 2019-04-08 NOTE — Assessment & Plan Note (Signed)
Suspected maybe back origin but pt has good strength in both legs and dtr = and b/l No pain with palpation  Aleve helps the pain --- only occasionally needed Ice/ heat  rto if pain con't or worsens

## 2019-10-08 DIAGNOSIS — Z79899 Other long term (current) drug therapy: Secondary | ICD-10-CM | POA: Diagnosis not present

## 2019-10-08 DIAGNOSIS — R635 Abnormal weight gain: Secondary | ICD-10-CM | POA: Diagnosis not present

## 2020-01-04 DIAGNOSIS — Z20822 Contact with and (suspected) exposure to covid-19: Secondary | ICD-10-CM | POA: Diagnosis not present

## 2020-02-16 DIAGNOSIS — R635 Abnormal weight gain: Secondary | ICD-10-CM | POA: Diagnosis not present

## 2020-02-16 DIAGNOSIS — Z79899 Other long term (current) drug therapy: Secondary | ICD-10-CM | POA: Diagnosis not present

## 2020-06-02 DIAGNOSIS — Z118 Encounter for screening for other infectious and parasitic diseases: Secondary | ICD-10-CM | POA: Diagnosis not present

## 2020-06-02 DIAGNOSIS — Z1151 Encounter for screening for human papillomavirus (HPV): Secondary | ICD-10-CM | POA: Diagnosis not present

## 2020-06-02 DIAGNOSIS — Z6837 Body mass index (BMI) 37.0-37.9, adult: Secondary | ICD-10-CM | POA: Diagnosis not present

## 2020-06-02 DIAGNOSIS — Z01419 Encounter for gynecological examination (general) (routine) without abnormal findings: Secondary | ICD-10-CM | POA: Diagnosis not present

## 2020-06-02 DIAGNOSIS — Z113 Encounter for screening for infections with a predominantly sexual mode of transmission: Secondary | ICD-10-CM | POA: Diagnosis not present

## 2020-06-02 DIAGNOSIS — Z124 Encounter for screening for malignant neoplasm of cervix: Secondary | ICD-10-CM | POA: Diagnosis not present

## 2020-06-09 LAB — HM PAP SMEAR
HM Pap smear: NORMAL
HPV, high-risk: NEGATIVE

## 2020-07-07 DIAGNOSIS — Z1231 Encounter for screening mammogram for malignant neoplasm of breast: Secondary | ICD-10-CM | POA: Diagnosis not present

## 2020-07-07 DIAGNOSIS — D251 Intramural leiomyoma of uterus: Secondary | ICD-10-CM | POA: Diagnosis not present

## 2020-07-07 DIAGNOSIS — D252 Subserosal leiomyoma of uterus: Secondary | ICD-10-CM | POA: Diagnosis not present

## 2020-11-03 DIAGNOSIS — Z20822 Contact with and (suspected) exposure to covid-19: Secondary | ICD-10-CM | POA: Diagnosis not present

## 2020-11-05 ENCOUNTER — Ambulatory Visit: Payer: Self-pay | Admitting: Family Medicine

## 2020-11-07 DIAGNOSIS — Z20822 Contact with and (suspected) exposure to covid-19: Secondary | ICD-10-CM | POA: Diagnosis not present

## 2020-11-10 DIAGNOSIS — U071 COVID-19: Secondary | ICD-10-CM | POA: Diagnosis not present

## 2020-11-10 DIAGNOSIS — Z20822 Contact with and (suspected) exposure to covid-19: Secondary | ICD-10-CM | POA: Diagnosis not present

## 2021-02-26 HISTORY — PX: LIPOSUCTION: SHX10

## 2021-02-28 ENCOUNTER — Other Ambulatory Visit: Payer: Self-pay | Admitting: Plastic Surgery

## 2021-02-28 DIAGNOSIS — L02415 Cutaneous abscess of right lower limb: Secondary | ICD-10-CM

## 2021-04-12 DIAGNOSIS — Z1159 Encounter for screening for other viral diseases: Secondary | ICD-10-CM | POA: Diagnosis not present

## 2021-04-12 DIAGNOSIS — Z113 Encounter for screening for infections with a predominantly sexual mode of transmission: Secondary | ICD-10-CM | POA: Diagnosis not present

## 2021-04-12 DIAGNOSIS — N898 Other specified noninflammatory disorders of vagina: Secondary | ICD-10-CM | POA: Diagnosis not present

## 2021-06-06 DIAGNOSIS — D259 Leiomyoma of uterus, unspecified: Secondary | ICD-10-CM | POA: Diagnosis not present

## 2021-07-27 DIAGNOSIS — Z6837 Body mass index (BMI) 37.0-37.9, adult: Secondary | ICD-10-CM | POA: Diagnosis not present

## 2021-07-27 DIAGNOSIS — Z01419 Encounter for gynecological examination (general) (routine) without abnormal findings: Secondary | ICD-10-CM | POA: Diagnosis not present

## 2021-07-27 DIAGNOSIS — Z1231 Encounter for screening mammogram for malignant neoplasm of breast: Secondary | ICD-10-CM | POA: Diagnosis not present

## 2021-07-27 DIAGNOSIS — N898 Other specified noninflammatory disorders of vagina: Secondary | ICD-10-CM | POA: Diagnosis not present

## 2021-07-27 LAB — HM MAMMOGRAPHY

## 2021-08-19 ENCOUNTER — Encounter: Payer: Self-pay | Admitting: Family Medicine

## 2021-08-19 ENCOUNTER — Ambulatory Visit (INDEPENDENT_AMBULATORY_CARE_PROVIDER_SITE_OTHER): Payer: BC Managed Care – PPO | Admitting: Family Medicine

## 2021-08-19 VITALS — BP 100/70 | HR 67 | Temp 98.1°F | Resp 18 | Ht 61.0 in | Wt 198.8 lb

## 2021-08-19 DIAGNOSIS — Z0000 Encounter for general adult medical examination without abnormal findings: Secondary | ICD-10-CM | POA: Insufficient documentation

## 2021-08-19 DIAGNOSIS — M79604 Pain in right leg: Secondary | ICD-10-CM

## 2021-08-19 DIAGNOSIS — M25561 Pain in right knee: Secondary | ICD-10-CM | POA: Diagnosis not present

## 2021-08-19 DIAGNOSIS — Z1159 Encounter for screening for other viral diseases: Secondary | ICD-10-CM

## 2021-08-19 DIAGNOSIS — G8929 Other chronic pain: Secondary | ICD-10-CM | POA: Insufficient documentation

## 2021-08-19 LAB — LIPID PANEL
Cholesterol: 151 mg/dL (ref 0–200)
HDL: 52.1 mg/dL (ref >39.00)
LDL Cholesterol: 87 mg/dL (ref 0–99)
NonHDL: 98.72
Total CHOL/HDL Ratio: 3
Triglycerides: 60 mg/dL (ref 0.0–149.0)
VLDL: 12 mg/dL (ref 0.0–40.0)

## 2021-08-19 LAB — CBC WITH DIFFERENTIAL/PLATELET
Basophils Absolute: 0 10*3/uL (ref 0.0–0.1)
Basophils Relative: 0.6 % (ref 0.0–3.0)
Eosinophils Absolute: 0 10*3/uL (ref 0.0–0.7)
Eosinophils Relative: 0.9 % (ref 0.0–5.0)
HCT: 41 % (ref 36.0–46.0)
Hemoglobin: 13.7 g/dL (ref 12.0–15.0)
Lymphocytes Relative: 36 % (ref 12.0–46.0)
Lymphs Abs: 1.5 10*3/uL (ref 0.7–4.0)
MCHC: 33.4 g/dL (ref 30.0–36.0)
MCV: 94.3 fl (ref 78.0–100.0)
Monocytes Absolute: 0.4 10*3/uL (ref 0.1–1.0)
Monocytes Relative: 9 % (ref 3.0–12.0)
Neutro Abs: 2.2 10*3/uL (ref 1.4–7.7)
Neutrophils Relative %: 53.5 % (ref 43.0–77.0)
Platelets: 182 10*3/uL (ref 150.0–400.0)
RBC: 4.34 Mil/uL (ref 3.87–5.11)
RDW: 12.8 % (ref 11.5–15.5)
WBC: 4.1 10*3/uL (ref 4.0–10.5)

## 2021-08-19 LAB — COMPREHENSIVE METABOLIC PANEL
ALT: 18 U/L (ref 0–35)
AST: 21 U/L (ref 0–37)
Albumin: 4.1 g/dL (ref 3.5–5.2)
Alkaline Phosphatase: 47 U/L (ref 39–117)
BUN: 12 mg/dL (ref 6–23)
CO2: 30 mEq/L (ref 19–32)
Calcium: 9.1 mg/dL (ref 8.4–10.5)
Chloride: 104 mEq/L (ref 96–112)
Creatinine, Ser: 0.94 mg/dL (ref 0.40–1.20)
GFR: 72.58 mL/min (ref >60.00)
Glucose, Bld: 88 mg/dL (ref 70–99)
Potassium: 4.1 mEq/L (ref 3.5–5.1)
Sodium: 137 mEq/L (ref 135–145)
Total Bilirubin: 0.9 mg/dL (ref 0.2–1.2)
Total Protein: 6.4 g/dL (ref 6.0–8.3)

## 2021-08-19 NOTE — Assessment & Plan Note (Signed)
Her ins will not pay for weight loss meds ?D/w pt diet and exercise  ?

## 2021-08-19 NOTE — Assessment & Plan Note (Signed)
Pt requesting referral to ortho.

## 2021-08-19 NOTE — Progress Notes (Signed)
? ?Subjective:  ? ?By signing my name below, I, Susan Bowers, attest that this documentation has been prepared under the direction and in the presence of Ann Held, DO. 08/19/2021   ? ? Patient ID: Susan Bowers, female    DOB: [birth year], 46 y.o.   MRN: [id number] ? ?Chief Complaint  ?Patient presents with  ? Annual Exam  ?  Pt states fasting   ? ? ?HPI ?Patient is in today for a comprehensive physical exam. ? ?She is doing well at this time. ? ?She sees an OBGYN and is UTD on pap smear and mammogram. ? ?She has a colonoscopy scheduled on 04/22. ? ?She has tried phentermine but did not like the way it made her feel and it caused constipation.  ? ?She denies fever, hearing loss, ear pain,congestion, sinus pain, sore throat, eye pain, chest pain, palpitations, cough, shortness of breath, wheezing, nausea. vomiting, diarrhea, constipation, blood in stool, dysuria,frequency, hematuria and headaches.  ? ?She exercises about 3 days a week. She complains of right knee pain and has had some cortisone injections which provides temporary relief.  ? ?UTD on dental and vision care. ? ?She has 2 Covid-19 vaccines at this time.  ? ?No recent changes in family medical history. She had a liposuction on her arms in October 2022. ? ?She takes about 2 drinks a week. She does not smoke tobacco or use drugs. She is sexually active with one person.  ? ?History reviewed. No pertinent past medical history. ? ?Past Surgical History:  ?Procedure Laterality Date  ? LIPOSUCTION Bilateral 02/2021  ? thimappa  ? ? ?Family History  ?Problem Relation Age of Onset  ? Alcohol abuse Father   ? Liver disease Father   ? Diabetes Maternal Aunt   ? ? ?Social History  ? ?Socioeconomic History  ? Marital status: Divorced  ?  Spouse name: Not on file  ? Number of children: 1  ? Years of education: Not on file  ? Highest education level: Not on file  ?Occupational History  ? 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  Occupation: therapist  ?  Employer: [employer]  ?Tobacco Use  ? Smoking status: Never  ? Smokeless tobacco: Never  ?Substance and Sexual Activity  ? Alcohol use: Yes  ?  Alcohol/week: 2.0 standard drinks  ?  Types: 2 Standard drinks or equivalent per week  ? Drug use: Never  ? Sexual activity: Yes  ?  Partners: Male  ?  Birth control/protection: Pill  ?Other Topics Concern  ? Not on file  ?Social History Narrative  ? Exercise -- 3 days a week   ? ?Social Determinants of Health  ? ?Financial Resource Strain: Not on file  ?Food Insecurity: Not on file  ?Transportation Needs: Not on file  ?Physical Activity: Not on file  ?Stress: Not on file  ?Social Connections: Not on file  ?Intimate Partner Violence: Not on file  ? ? ?Outpatient Medications Prior to Visit  ?Medication Sig Dispense Refill  ? ORTHO TRI-CYCLEN, 28, 0.18/0.215/0.25 MG-35 MCG tablet Take 1 tablet by mouth daily. (Patient not taking: Reported on 08/19/2021)  10  ? phentermine (ADIPEX-P) 37.5 MG tablet TK 1 T PO D (Patient not taking: Reported on 08/19/2021)    ? ?No facility-administered medications prior to visit.  ? ? ?No Known Allergies ? ?Review of Systems  ?Constitutional:  Negative for fever.  ?HENT:  Negative for congestion, ear pain, hearing loss, sinus pain  and sore throat.   ?Eyes:  Negative for blurred vision and pain.  ?Respiratory:  Negative for cough, sputum production, shortness of breath and wheezing.   ?Cardiovascular:  Negative for chest pain and palpitations.  ?Gastrointestinal:  Negative for blood in stool, constipation, diarrhea, nausea and vomiting.  ?Genitourinary:  Negative for dysuria, frequency, hematuria and urgency.  ?Musculoskeletal:  Positive for joint pain (right knee). Negative for back pain, falls and myalgias.  ?Neurological:  Negative for dizziness, sensory change, loss of consciousness, weakness and headaches.  ?Endo/Heme/Allergies:  Negative for environmental allergies. Does not bruise/bleed easily.   ?Psychiatric/Behavioral:  Negative for depression and suicidal ideas. The patient is not nervous/anxious and does not have insomnia.   ? ?   ?Objective:  ?  ?Physical Exam ?Constitutional:   ?   General: She is not in acute distress. ?   Appearance: Normal appearance. She is not ill-appearing.  ?HENT:  ?   Head: Normocephalic and atraumatic.  ?   Right Ear: Tympanic membrane, ear canal and external ear normal.  ?   Left Ear: Tympanic membrane, ear canal and external ear normal.  ?Eyes:  ?   Extraocular Movements: Extraocular movements intact.  ?   Pupils: Pupils are equal, round, and reactive to light.  ?Cardiovascular:  ?   Rate and Rhythm: Normal rate and regular rhythm.  ?   Pulses: Normal pulses.  ?   Heart sounds: Normal heart sounds. No murmur heard. ?  No gallop.  ?Pulmonary:  ?   Effort: Pulmonary effort is normal. No respiratory distress.  ?   Breath sounds: Normal breath sounds. No wheezing, rhonchi or rales.  ?Abdominal:  ?   General: Bowel sounds are normal. There is no distension.  ?   Palpations: Abdomen is soft. There is no mass.  ?   Tenderness: There is no abdominal tenderness. There is no guarding or rebound.  ?   Hernia: No hernia is present.  ?Musculoskeletal:  ?   Cervical back: Normal range of motion and neck supple.  ?Lymphadenopathy:  ?   Cervical: No cervical adenopathy.  ?Skin: ?   General: Skin is warm and dry.  ?Neurological:  ?   Mental Status: She is alert and oriented to person, place, and time.  ?Psychiatric:     ?   Behavior: Behavior normal.  ? ? ?BP 100/70 (BP Location: Left Arm, Patient Position: Sitting, Cuff Size: Large)   Pulse 67   Temp 98.1 ?F (36.7 ?C) (Oral)   Resp 18   Ht $R'5\' 1"'by$  (1.549 m)   Wt 198 lb 12.8 oz (90.2 kg)   SpO2 100%   BMI 37.56 kg/m?  ?Wt Readings from Last 3 Encounters:  ?08/19/21 198 lb 12.8 oz (90.2 kg)  ?04/07/19 185 lb 12.8 oz (84.3 kg)  ?06/18/17 202 lb (91.6 kg)  ? ? ?Diabetic Foot Exam - Simple   ?No data filed ?  ? ?No results found for: WBC,  HGB, HCT, PLT, GLUCOSE, CHOL, TRIG, HDL, LDLDIRECT, LDLCALC, ALT, AST, NA, K, CL, CREATININE, BUN, CO2, TSH, PSA, INR, GLUF, HGBA1C, MICROALBUR ? ?No results found for: TSH ?No results found for: WBC, HGB, HCT, MCV, PLT ?No results found for: NA, K, CHLORIDE, CO2, GLUCOSE, BUN, CREATININE, BILITOT, ALKPHOS, AST, ALT, PROT, ALBUMIN, CALCIUM, ANIONGAP, EGFR, GFR ?No results found for: CHOL ?No results found for: HDL ?No results found for: [place] ?No results found for: TRIG ?No results found for: CHOLHDL ?No results found for: HGBA1C ? ?   ? ?  Mammogram- Last checked on 07/29/2021. Results were normal. Repeat in 1 year. ?Assessment & Plan:  ? ?Problem List Items Addressed This Visit   ? ?  ? Unprioritized  ? Right leg pain  ? Chronic pain of right knee  ?  Pt requesting referral to ortho ?  ?  ? Relevant Orders  ? Ambulatory referral to Orthopedic Surgery  ? Morbid obesity ([place])  ?  Her ins will not pay for weight loss meds ?D/w pt diet and exercise  ?  ?  ? Preventative health care - Primary  ?  ghm utd ?Check labs ? colon to be done next month ?pap / mamm per gyn ?  ?  ? Relevant Orders  ? CBC with Differential/Platelet  ? Comprehensive metabolic panel  ? Lipid panel  ? TSH  ? ?Other Visit Diagnoses   ? ? Need for hepatitis C screening test      ? Relevant Orders  ? Hepatitis C antibody  ? ?  ? ? ?No orders of the defined types were placed in this encounter. ? ? ?I,Susan Bowers,acting as a Education administrator for [employer], DO.,have documented all relevant documentation on the behalf of Ann Held, DO,as directed by  Ann Held, DO while in the presence of Ann Held, DO.  ? ?I, Ann Held, DO., personally preformed the services described in this documentation.  All medical record entries made by the scribe were at my direction and in my presence.  I have reviewed the chart and discharge instructions (if applicable) and agree that the record reflects my personal performance and is  accurate and complete. 08/19/2021 ?

## 2021-08-19 NOTE — Assessment & Plan Note (Signed)
ghm utd ?Check labs ? colon to be done next month ?pap / mamm per gyn ?

## 2021-08-19 NOTE — Patient Instructions (Signed)

## 2021-08-22 LAB — HEPATITIS C ANTIBODY
Hepatitis C Ab: NONREACTIVE
SIGNAL TO CUT-OFF: 0.09 (ref <1.00)

## 2021-08-23 LAB — TSH: TSH: 4.59 u[IU]/mL (ref 0.35–5.50)

## 2021-08-25 ENCOUNTER — Telehealth: Payer: Self-pay | Admitting: Family Medicine

## 2021-08-25 DIAGNOSIS — S0003XA Contusion of scalp, initial encounter: Secondary | ICD-10-CM | POA: Diagnosis not present

## 2021-08-25 NOTE — Telephone Encounter (Signed)
Nurse Assessment ?Nurse: Carylon Perches, RN, Hilda Lias Date/Time Lamount Cohen Time): 08/25/2021 9:44:25 AM ?Confirm and document reason for call. If ?symptomatic, describe symptoms. ?---Caller states she hit her head yesterday and it is ?still sore today. Hit it on the [employer]. Right ?side of the head, two inches inside the hairline. no ?LOC. Today is having a slight headache, soreness and ?tenderness. ?Does the patient have any new or worsening ?symptoms? ---Yes ?Will a triage be completed? ---Yes ?Related visit to physician within the last 2 weeks? ---No ?Does the PT have any chronic conditions? (i.e. ?diabetes, asthma, this includes High risk factors for ?pregnancy, etc.) ?---No ?Is the patient pregnant or possibly pregnant? (Ask ?all females between the ages of 58-55) ---No ?Is this a behavioral health or substance abuse call? ---No ?Guidelines ?Guideline Title Affirmed Question Affirmed Notes Nurse Date/Time (Eastern ?Time) ?Head Injury Seizure (convulsion) ?occurred (Exception: ?prior history of ?seizures and now ?alert and without ?Acute Neuro ?Symptoms) ?Carylon Perches, RN, Hilda Lias 08/25/2021 9:47:06 ?AM ?PLEASE NOTE: All timestamps contained within this report are represented as [place] Standard Time. ?CONFIDENTIALTY NOTICE: This fax transmission is intended only for the addressee. It contains information that is legally privileged, confidential or ?otherwise protected from use or disclosure. If you are not the intended recipient, you are strictly prohibited from reviewing, disclosing, copying using ?or disseminating any of this information or taking any action in reliance on or regarding this information. If you have received this fax in error, please ?notify us immediately by telephone so that we can arrange for its return to [place]. Phone: [phone], Toll-Free: [phone], Fax: [phone] ?Page: 2 of 2 ?Call Id: [id number] ?Disp. Time (Eastern ?Time) Disposition Final User ?08/25/2021 9:42:13 AM Send to Urgent Queue Mockler,  Tiffany ?08/25/2021 9:51:16 AM 911 Outcome Documentation Carylon Perches, RN, Hilda Lias ?Reason: EMS transport declined, ?someone will drive her to ER ?12/07/1974 9:50:31 AM Call EMS 911 Now Yes Carylon Perches, RN, Hilda Lias ?Caller Disagree/Comply Comply ?Caller Understands Yes ?PreDisposition Did not know what to do ?Care Advice Given Per Guideline ?CALL EMS 911 NOW: * Immediate medical attention is needed. You need to hang up and call 911 (or an ambulance). * Triager ?Discretion: I'll call you back in a few minutes to be sure you were able to reach them. CARE ADVICE given per Head Injury (Adult) ?guideline. ?Referrals ?GO TO FACILITY UNDECIDED ?

## 2021-08-25 NOTE — Telephone Encounter (Signed)
Pt states she bumped her head on the kitchen island and is now concerned.  ? ?Transferred to triage regarding symps. ?

## 2021-08-25 NOTE — Telephone Encounter (Signed)
Noted  

## 2021-08-25 NOTE — Telephone Encounter (Signed)
Patient stated that she was seen and the doctor stated that she had a head contusion with bleeding under the scalp. ?

## 2021-09-17 DIAGNOSIS — D120 Benign neoplasm of cecum: Secondary | ICD-10-CM | POA: Diagnosis not present

## 2021-09-17 DIAGNOSIS — Z1211 Encounter for screening for malignant neoplasm of colon: Secondary | ICD-10-CM | POA: Diagnosis not present

## 2021-09-17 LAB — HM COLONOSCOPY

## 2022-01-12 DIAGNOSIS — N898 Other specified noninflammatory disorders of vagina: Secondary | ICD-10-CM | POA: Diagnosis not present

## 2022-08-21 DIAGNOSIS — N760 Acute vaginitis: Secondary | ICD-10-CM | POA: Diagnosis not present

## 2022-08-21 DIAGNOSIS — Z1231 Encounter for screening mammogram for malignant neoplasm of breast: Secondary | ICD-10-CM | POA: Diagnosis not present

## 2022-08-21 DIAGNOSIS — Z01419 Encounter for gynecological examination (general) (routine) without abnormal findings: Secondary | ICD-10-CM | POA: Diagnosis not present

## 2022-08-21 DIAGNOSIS — Z3009 Encounter for other general counseling and advice on contraception: Secondary | ICD-10-CM | POA: Diagnosis not present

## 2022-08-21 LAB — HM MAMMOGRAPHY

## 2022-08-24 ENCOUNTER — Encounter: Payer: BC Managed Care – PPO | Admitting: Family Medicine

## 2022-09-13 ENCOUNTER — Encounter: Payer: Self-pay | Admitting: Family Medicine

## 2022-10-12 NOTE — Progress Notes (Signed)
Subjective:   By signing my name below, I, Susan Bowers, attest that this documentation has been prepared under the direction and in the presence of Susan Schultz, DO 10/13/22   Patient ID: Susan Bowers, female    DOB: [birth year], 47 y.o.   MRN: [id number]  Chief Complaint  Patient presents with   Annual Exam    Pt states not fasting     HPI Patient is in today for a comprehensive physical exam.   She complains of chronic right knee pain, which she attributes to an injury from a fall while she was in the [employer] 20+ years ago. She recalls her fall occurred while running and she fell her right knee. She previously would receive cortisone injections, which would provide relief at the time.   She states the pain has worsened and now prohibits her from doing lunges or zumba. She notes the pain tends to occur when she walks up stairs or when bending forwards. She reports she has felt her knee will give out while walking. She is able to do the stair master 3x a week with no  limitations but using the elliptical is very painful. She occasionally will take Tylenol for relief. Has not used any topical creams or gels. She denies any pain while sitting or at rest.   Last colonoscopy: 09/17/2021. Polyp removed. Otherwise normal.   Last mammogram: 08/21/2022. Results were normal.   History reviewed. No pertinent past medical history.  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    Employer: 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  Tobacco Use   Smoking status: Never   Smokeless tobacco: Never  Substance and Sexual Activity   Alcohol use: Yes    Alcohol/week: 2.0 standard drinks of alcohol    Types: 2 Standard drinks or equivalent per week   Drug use: Never   Sexual activity: Yes    Partners: Male    Birth control/protection: Pill  Other Topics Concern   Not on file  Social History Narrative   Exercise -- 3 days a week    Social Determinants of Health   Financial Resource Strain: Not on file  Food Insecurity: Not on file  Transportation Needs: Not on file  Physical Activity: Not on file  Stress: Not on file  Social Connections: Not on file  Intimate Partner Violence: Not on file    No outpatient medications prior to visit.   No facility-administered medications prior to visit.    No Known Allergies  Review of Systems  Constitutional:  Negative for chills, fever and malaise/fatigue.  HENT:  Negative for congestion and hearing loss.   Eyes:  Negative for blurred vision and discharge.  Respiratory:  Negative for cough, sputum production and shortness of breath.   Cardiovascular:  Negative for chest pain, palpitations and leg swelling.  Gastrointestinal:  Negative for abdominal pain, blood in stool, constipation, diarrhea, heartburn, nausea and vomiting.  Genitourinary:  Negative for dysuria, frequency, hematuria and urgency.  Musculoskeletal:  Positive for joint pain (right knee pain). Negative for back pain, falls and myalgias.  Skin:  Negative for rash.  Neurological:  Negative for dizziness, sensory change, loss of consciousness, weakness and headaches.  Endo/Heme/Allergies:  Negative for environmental allergies. Does not bruise/bleed easily.  Psychiatric/Behavioral:  Negative for depression and suicidal ideas. The patient is not nervous/anxious and does not have insomnia.        Objective:    Physical Exam Vitals and nursing note reviewed.  Constitutional:       General: She is not in acute distress.    Appearance: Normal appearance. She is well-developed.  HENT:     Head: Normocephalic and atraumatic.     Right Ear: External ear normal.     Left Ear: External ear normal.  Eyes:     Extraocular Movements: Extraocular movements intact.     Conjunctiva/sclera: Conjunctivae normal.     Pupils: Pupils are equal, round, and reactive to light.  Neck:     Thyroid: No thyromegaly.     Vascular: No carotid bruit or JVD.  Cardiovascular:     Rate and Rhythm: Normal rate and regular rhythm.     Pulses: Normal pulses.     Heart sounds: Normal heart sounds. No murmur heard.    No gallop.  Pulmonary:     Effort: Pulmonary effort is normal. No respiratory distress.     Breath sounds: Normal breath sounds. No wheezing or rales.  Chest:     Chest wall: No tenderness.  Musculoskeletal:     Cervical back: Normal range of motion and neck supple.  Skin:    General: Skin is warm and dry.  Neurological:     General: No focal deficit present.     Mental Status: She is alert and oriented to person, place, and time.  Psychiatric:        Mood and Affect: Mood normal.        Judgment: Judgment normal.     BP 100/80 (BP Location: Left Arm, Patient Position: Sitting, Cuff Size: Large)   Pulse 75   Temp 98.5 F (36.9 C) (Oral)   Resp 16   Ht 5\' 1"  (1.549 m)   Wt 202 lb 6.4 oz (91.8 kg)   SpO2 99%   BMI 38.24 kg/m  Wt Readings from Last 3 Encounters:  10/13/22 202 lb 6.4 oz (91.8 kg)  08/19/21 198 lb 12.8 oz (90.2 kg)  04/07/19 185 lb 12.8 oz (84.3 kg)       Assessment & Plan:  Preventative health care Assessment & Plan: Ghm utd Check labs  See AVS Health Maintenance  Topic Date Due   COVID-19 Vaccine (3 - 2023-24 season) 10/29/2022 (Originally 01/27/2022)   HIV Screening  10/13/2023 (Originally 09/29/1989)   INFLUENZA VACCINE  12/28/2022   MAMMOGRAM  08/21/2023   PAP SMEAR-Modifier  07/22/2024   DTaP/Tdap/Td (2 - Td or Tdap) 06/19/2027    COLONOSCOPY (Pts 45-52yrs Insurance coverage will need to be confirmed)  09/18/2031   Hepatitis C Screening  Completed   HPV VACCINES  Aged Out     Orders: -     CBC with Differential/Platelet -     Comprehensive metabolic panel -     Lipid panel -     TSH  Chronic pain of right knee Assessment & Plan: Check labs Refer to ortho  Voltaren gel otc   Orders: -     Ambulatory  referral to Orthopedic Surgery -     DG Knee Complete 4 Views Right; Future     I,Rachel Rivera,acting as a scribe for Susan Schultz, DO.,have documented all relevant documentation on the behalf of Susan Schultz, DO,as directed by  Susan Schultz, DO while in the presence of Susan Schultz, DO.   I, Susan Schultz, DO, personally preformed the services described in this documentation.  All medical record entries made by the scribe were at my direction and in my presence.  I have reviewed the chart and discharge instructions (if applicable) and agree that the record reflects my personal performance and is accurate and complete. 10/13/22   Susan Schultz, DO

## 2022-10-13 ENCOUNTER — Encounter: Payer: Self-pay | Admitting: Family Medicine

## 2022-10-13 ENCOUNTER — Ambulatory Visit (INDEPENDENT_AMBULATORY_CARE_PROVIDER_SITE_OTHER): Payer: 59 | Admitting: Family Medicine

## 2022-10-13 VITALS — BP 100/80 | HR 75 | Temp 98.5°F | Resp 16 | Ht 61.0 in | Wt 202.4 lb

## 2022-10-13 DIAGNOSIS — G8929 Other chronic pain: Secondary | ICD-10-CM

## 2022-10-13 DIAGNOSIS — M25561 Pain in right knee: Secondary | ICD-10-CM

## 2022-10-13 DIAGNOSIS — Z0000 Encounter for general adult medical examination without abnormal findings: Secondary | ICD-10-CM | POA: Diagnosis not present

## 2022-10-13 LAB — LIPID PANEL
Cholesterol: 148 mg/dL (ref 0–200)
HDL: 49.5 mg/dL (ref >39.00)
LDL Cholesterol: 81 mg/dL (ref 0–99)
NonHDL: 98.28
Total CHOL/HDL Ratio: 3
Triglycerides: 85 mg/dL (ref 0.0–149.0)
VLDL: 17 mg/dL (ref 0.0–40.0)

## 2022-10-13 LAB — COMPREHENSIVE METABOLIC PANEL
ALT: 12 U/L (ref 0–35)
AST: 17 U/L (ref 0–37)
Albumin: 3.8 g/dL (ref 3.5–5.2)
Alkaline Phosphatase: 43 U/L (ref 39–117)
BUN: 13 mg/dL (ref 6–23)
CO2: 26 mEq/L (ref 19–32)
Calcium: 9.3 mg/dL (ref 8.4–10.5)
Chloride: 103 mEq/L (ref 96–112)
Creatinine, Ser: 0.87 mg/dL (ref 0.40–1.20)
GFR: 79 mL/min (ref >60.00)
Glucose, Bld: 84 mg/dL (ref 70–99)
Potassium: 4.2 mEq/L (ref 3.5–5.1)
Sodium: 137 mEq/L (ref 135–145)
Total Bilirubin: 0.6 mg/dL (ref 0.2–1.2)
Total Protein: 6.5 g/dL (ref 6.0–8.3)

## 2022-10-13 LAB — CBC WITH DIFFERENTIAL/PLATELET
Basophils Absolute: 0 10*3/uL (ref 0.0–0.1)
Basophils Relative: 0.9 % (ref 0.0–3.0)
Eosinophils Absolute: 0.1 10*3/uL (ref 0.0–0.7)
Eosinophils Relative: 1.4 % (ref 0.0–5.0)
HCT: 39.6 % (ref 36.0–46.0)
Hemoglobin: 13.3 g/dL (ref 12.0–15.0)
Lymphocytes Relative: 30.2 % (ref 12.0–46.0)
Lymphs Abs: 1.7 10*3/uL (ref 0.7–4.0)
MCHC: 33.7 g/dL (ref 30.0–36.0)
MCV: 93.4 fl (ref 78.0–100.0)
Monocytes Absolute: 0.5 10*3/uL (ref 0.1–1.0)
Monocytes Relative: 9 % (ref 3.0–12.0)
Neutro Abs: 3.2 10*3/uL (ref 1.4–7.7)
Neutrophils Relative %: 58.5 % (ref 43.0–77.0)
Platelets: 193 10*3/uL (ref 150.0–400.0)
RBC: 4.24 Mil/uL (ref 3.87–5.11)
RDW: 12.7 % (ref 11.5–15.5)
WBC: 5.5 10*3/uL (ref 4.0–10.5)

## 2022-10-13 LAB — TSH: TSH: 4.08 u[IU]/mL (ref 0.35–5.50)

## 2022-10-13 NOTE — Assessment & Plan Note (Signed)
Check labs Refer to ortho  Voltaren gel otc

## 2022-10-13 NOTE — Assessment & Plan Note (Signed)
Ghm utd Check labs  See AVS Health Maintenance  Topic Date Due   COVID-19 Vaccine (3 - 2023-24 season) 10/29/2022 (Originally 01/27/2022)   HIV Screening  10/13/2023 (Originally 09/29/1989)   INFLUENZA VACCINE  12/28/2022   MAMMOGRAM  08/21/2023   PAP SMEAR-Modifier  07/22/2024   DTaP/Tdap/Td (2 - Td or Tdap) 06/19/2027   COLONOSCOPY (Pts 45-74yrs Insurance coverage will need to be confirmed)  09/18/2031   Hepatitis C Screening  Completed   HPV VACCINES  Aged Out

## 2022-10-13 NOTE — Patient Instructions (Signed)
Preventive Care 40-48 Years Old, Female Preventive care refers to lifestyle choices and visits with your health care provider that can promote health and wellness. Preventive care visits are also called wellness exams. What can I expect for my preventive care visit? Counseling Your health care provider may ask you questions about your: Medical history, including: Past medical problems. Family medical history. Pregnancy history. Current health, including: Menstrual cycle. Method of birth control. Emotional well-being. Home life and relationship well-being. Sexual activity and sexual health. Lifestyle, including: Alcohol, nicotine or tobacco, and drug use. Access to firearms. Diet, exercise, and sleep habits. Work and work environment. Sunscreen use. Safety issues such as seatbelt and bike helmet use. Physical exam Your health care provider will check your: Height and weight. These may be used to calculate your BMI (body mass index). BMI is a measurement that tells if you are at a healthy weight. Waist circumference. This measures the distance around your waistline. This measurement also tells if you are at a healthy weight and may help predict your risk of certain diseases, such as type 2 diabetes and high blood pressure. Heart rate and blood pressure. Body temperature. Skin for abnormal spots. What immunizations do I need?  Vaccines are usually given at various ages, according to a schedule. Your health care provider will recommend vaccines for you based on your age, medical history, and lifestyle or other factors, such as travel or where you work. What tests do I need? Screening Your health care provider may recommend screening tests for certain conditions. This may include: Lipid and cholesterol levels. Diabetes screening. This is done by checking your blood sugar (glucose) after you have not eaten for a while (fasting). Pelvic exam and Pap test. Hepatitis B test. Hepatitis C  test. HIV (human immunodeficiency virus) test. STI (sexually transmitted infection) testing, if you are at risk. Lung cancer screening. Colorectal cancer screening. Mammogram. Talk with your health care provider about when you should start having regular mammograms. This may depend on whether you have a family history of breast cancer. BRCA-related cancer screening. This may be done if you have a family history of breast, ovarian, tubal, or peritoneal cancers. Bone density scan. This is done to screen for osteoporosis. Talk with your health care provider about your test results, treatment options, and if necessary, the need for more tests. Follow these instructions at home: Eating and drinking  Eat a diet that includes fresh fruits and vegetables, whole grains, lean protein, and low-fat dairy products. Take vitamin and mineral supplements as recommended by your health care provider. Do not drink alcohol if: Your health care provider tells you not to drink. You are pregnant, may be pregnant, or are planning to become pregnant. If you drink alcohol: Limit how much you have to 0-1 drink a day. Know how much alcohol is in your drink. In the [place] one drink equals one 12 oz bottle of beer (355 mL), one 5 oz glass of wine (148 mL), or one 1 oz glass of hard liquor (44 mL). Lifestyle Brush your teeth every morning and night with fluoride toothpaste. Floss one time each day. Exercise for at least 30 minutes 5 or more days each week. Do not use any products that contain nicotine or tobacco. These products include cigarettes, chewing tobacco, and vaping devices, such as e-cigarettes. If you need help quitting, ask your health care provider. Do not use drugs. If you are sexually active, practice safe sex. Use a condom or other form of protection to   prevent STIs. If you do not wish to become pregnant, use a form of birth control. If you plan to become pregnant, see your health care provider for a  prepregnancy visit. Take aspirin only as told by your health care provider. Make sure that you understand how much to take and what form to take. Work with your health care provider to find out whether it is safe and beneficial for you to take aspirin daily. Find healthy ways to manage stress, such as: Meditation, yoga, or listening to music. Journaling. Talking to a trusted person. Spending time with friends and family. Minimize exposure to UV radiation to reduce your risk of skin cancer. Safety Always wear your seat belt while driving or riding in a vehicle. Do not drive: If you have been drinking alcohol. Do not ride with someone who has been drinking. When you are tired or distracted. While texting. If you have been using any mind-altering substances or drugs. Wear a helmet and other protective equipment during sports activities. If you have firearms in your house, make sure you follow all gun safety procedures. Seek help if you have been physically or sexually abused. What's next? Visit your health care provider once a year for an annual wellness visit. Ask your health care provider how often you should have your eyes and teeth checked. Stay up to date on all vaccines. This information is not intended to replace advice given to you by your health care provider. Make sure you discuss any questions you have with your health care provider. Document Revised: 11/10/2020 Document Reviewed: 11/10/2020 Elsevier Patient Education  2023 Elsevier Inc.  

## 2022-10-30 DIAGNOSIS — M25561 Pain in right knee: Secondary | ICD-10-CM | POA: Diagnosis not present

## 2022-11-21 ENCOUNTER — Encounter: Payer: Self-pay | Admitting: Family Medicine

## 2022-11-21 ENCOUNTER — Telehealth (INDEPENDENT_AMBULATORY_CARE_PROVIDER_SITE_OTHER): Payer: 59 | Admitting: Family Medicine

## 2022-11-21 NOTE — Progress Notes (Signed)
MyChart Video Visit    Virtual Visit via Video Note   This patient is at least at moderate risk for complications without adequate follow up. This format is felt to be most appropriate for this patient at this time. Physical exam was limited by quality of the video and audio technology used for the visit. Susan Bowers was able to get the patient set up on a video visit.  Patient location: office  Patient and provider in visit Provider location: Office  I discussed the limitations of evaluation and management by telemedicine and the availability of in person appointments. The patient expressed understanding and agreed to proceed.  Visit Date: 11/21/2022  Today's healthcare provider: [name], DO     Subjective:    Patient ID: Susan Bowers, female    DOB: 06-08-1975, 47 y.o.   MRN: [id number]  Chief Complaint  Patient presents with   Weight Loss    HPI Discussed the use of AI scribe software for clinical note transcription with the patient, who gave verbal consent to proceed.  History of Present Illness   The patient, with a history of obesity, presents for a discussion about weight loss medication options. They were previously taking phentermine, but discontinued due to side effects, including insomnia. They express interest in exploring other options for weight loss medication.  In addition to medication, the patient is attempting to lose weight through diet and exercise. They report consuming a diet rich in green smoothies and low in sugar and bread. They also report exercising at the gym, but their activity is limited due to a knee issue. They are scheduled for an MRI to evaluate the knee, but express concern about the cost. The patient also mentions that they consume alcohol two to three times a week.        No past medical history on file.  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    Employer: 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  Tobacco Use   Smoking status: Never   Smokeless tobacco: Never  Substance and Sexual Activity   Alcohol use: Yes    Alcohol/week: 2.0 standard drinks of alcohol    Types: 2 Standard drinks or equivalent per week   Drug use: Never   Sexual activity: Yes    Partners: Male    Birth control/protection: Pill  Other Topics Concern   Not on file  Social History Narrative   Exercise -- 3 days a week    Social Determinants of Health   Financial Resource Strain: Not on file  Food Insecurity: Not on file  Transportation Needs: Not on file  Physical Activity: Not on file  Stress: Not on file  Social Connections: Not on file  Intimate Partner Violence: Not on file    No outpatient medications prior to visit.   No facility-administered medications prior to visit.    No Known Allergies  Review of Systems  Constitutional:  Negative for fever and malaise/fatigue.  HENT:  Negative for congestion.   Eyes:  Negative for blurred vision.  Respiratory:  Negative for cough and shortness of breath.   Cardiovascular:  Negative for chest pain, palpitations and leg swelling.  Gastrointestinal:  Negative for vomiting.  Musculoskeletal:  Negative for back pain.  Skin:  Negative for rash.  Neurological:  Negative for loss of consciousness and headaches.       Objective:    Physical Exam Vitals and nursing note reviewed.  Constitutional:      Appearance: She is well-developed.  HENT:     Head: Normocephalic and atraumatic.   Eyes:     Conjunctiva/sclera: Conjunctivae normal.  Neck:     Thyroid: No thyromegaly.     Vascular: No carotid bruit or JVD.  Cardiovascular:     Rate and Rhythm: Normal rate and regular rhythm.     Heart sounds: Normal heart sounds. No murmur heard. Pulmonary:     Effort: Pulmonary effort is normal. No respiratory distress.     Breath sounds: Normal breath sounds. No wheezing or rales.  Chest:     Chest wall: No tenderness.  Musculoskeletal:     Cervical back: Normal range of motion and neck supple.  Neurological:     Mental Status: She is alert and oriented to person, place, and time.     There were no vitals taken for this visit. Wt Readings from Last 3 Encounters:  10/13/22 202 lb 6.4 oz (91.8 kg)  08/19/21 198 lb 12.8 oz (90.2 kg)  04/07/19 185 lb 12.8 oz (84.3 kg)       Assessment & Plan:  Morbid obesity (HCC) Assessment & Plan: Pt will check with her ins about what weight loss meds are covered      Assessment and Plan    Obesity: Patient previously on Phentermine, but discontinued due to side effects. Discussed alternative weight loss medications including Wegovy, Zepbound, and Qsymia. Patient to check with insurance regarding coverage. - Patient to check insurance coverage for [place], Zepbound, and Qsymia. - Once insurance coverage is confirmed, select appropriate medication and initiate treatment.  Knee Pain: Limiting patient's ability to exercise. MRI recommended but patient concerned about cost. - Patient to explore options for more affordable MRI.  Diet and Exercise: Patient has made dietary changes and is attempting to exercise despite knee pain. Alcohol intake noted. - Encourage continuation of healthy dietary changes and exercise as tolerated. - Consider discussing alcohol intake and potential impact on weight loss in future visits.       I discussed the assessment and treatment plan with the patient. The patient was provided an opportunity to ask  questions and all were answered. The patient agreed with the plan and demonstrated an understanding of the instructions.   The patient was advised to call back or seek an in-person evaluation if the symptoms worsen or if the condition fails to improve as anticipated.  [name], DO [facility] [phone] (phone) [phone] (fax)  [facility]

## 2022-11-21 NOTE — Assessment & Plan Note (Signed)
Pt will check with her ins about what weight loss meds are covered

## 2022-11-27 ENCOUNTER — Encounter: Payer: Self-pay | Admitting: Family Medicine

## 2022-11-28 ENCOUNTER — Other Ambulatory Visit: Payer: Self-pay | Admitting: Family Medicine

## 2022-11-28 MED ORDER — QSYMIA 3.75-23 MG PO CP24
1.0000 | ORAL_CAPSULE | Freq: Every morning | ORAL | 0 refills | Status: DC
Start: 2022-11-28 — End: 2024-01-07

## 2022-12-04 NOTE — Telephone Encounter (Signed)
yes

## 2022-12-17 ENCOUNTER — Encounter: Payer: Self-pay | Admitting: Family Medicine

## 2023-04-12 ENCOUNTER — Encounter: Payer: Self-pay | Admitting: Family Medicine

## 2023-04-13 ENCOUNTER — Other Ambulatory Visit: Payer: Self-pay | Admitting: Family Medicine

## 2023-04-13 MED ORDER — PHENTERMINE HCL 15 MG PO CAPS
15.0000 mg | ORAL_CAPSULE | ORAL | 0 refills | Status: DC
Start: 2023-04-13 — End: 2024-01-07

## 2023-10-15 ENCOUNTER — Encounter: Payer: 59 | Admitting: Family Medicine

## 2023-10-15 LAB — HM MAMMOGRAPHY

## 2024-01-07 ENCOUNTER — Ambulatory Visit: Payer: Self-pay | Admitting: Family Medicine

## 2024-01-07 ENCOUNTER — Encounter: Payer: Self-pay | Admitting: Family Medicine

## 2024-01-07 MED ORDER — TIRZEPATIDE-WEIGHT MANAGEMENT 2.5 MG/0.5ML ~~LOC~~ SOLN: 2.5000 mg | SUBCUTANEOUS | 0 refills | Status: DC

## 2024-01-07 NOTE — Progress Notes (Signed)
 Subjective:    Patient ID: Susan Bowers, female    DOB: 1976-02-20, 48 y.o.   MRN: [id number]  Chief Complaint  Patient presents with   Weight Check    HPI Patient is in today to discuss weight loss.   Discussed the use of AI scribe software for clinical note transcription with the patient, who gave verbal consent to proceed.  History of Present Illness Susan Bowers is a 48 year old female who presents for discontinuation of phentermine  and discussion of weight management options.  She is seeking to discontinue phentermine  and explore alternative weight management options. She previously considered Qsymia  but did not start it due to concerns about its interaction with her birth control. Her insurance does not cover weight loss medications, but she is willing to pay out of pocket for alternatives.  She maintains her eating habits and exercise routine, including working out at [employer]. Despite her efforts, she has experienced fluctuations in weight, losing two pounds since May but gaining two pounds in March. Her partner suggested that her diet might be a contributing factor to her weight management challenges.  She has a history of fibroids, which were last checked two years ago and were the size of a small orange. She experiences worsening menstrual cramps, noting variability in severity from month to month. She manages the cramps with Aleve and non-medication methods such as hot showers and  heating pads. She is currently on day one of her menstrual cycle.  In her family history, her mother went through menopause in her early fifties, and she has a maternal aunt with diabetes. She does not have a personal history of diabetes or liver disease. No sleep apnea, but she reports snoring only when extremely tired.    History reviewed. No pertinent past medical history.  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    Employer: Production assistant, 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  Tobacco Use   Smoking status: Never   Smokeless tobacco: Never  Substance and Sexual Activity   Alcohol use: Yes    Alcohol/week: 2.0 standard drinks of alcohol    Types: 2 Standard drinks or equivalent per week   Drug use: Never   Sexual activity: Yes    Partners: Male    Birth control/protection: Pill  Other Topics Concern   Not on file  Social History Narrative   Exercise -- 3 days a week    Social Drivers of Corporate investment banker Strain: Not on file  Food Insecurity: Not on file  Transportation Needs: No Transportation Needs (01/04/2024)   PRAPARE - Administrator, Civil Service (Medical): No    Lack of Transportation (Non-Medical): No  Physical Activity: Sufficiently Active (01/04/2024)   Exercise Vital Sign    Days of Exercise per Week: 4 days    Minutes of Exercise per Session: 90 min  Stress: Not on  file  Social Connections: Unknown (09/29/2021)   Received from [facility]   Social Network    Social Network: Not on file  Intimate Partner Violence: Unknown (09/02/2021)   Received from [facility]   HITS    Physically Hurt: Not on file    Insult or Talk Down To: Not on file    Threaten Physical Harm: Not on file    Scream or Curse: Not on file    Outpatient Medications Prior to Visit  Medication Sig Dispense Refill   Norgestimate-Ethinyl Estradiol Triphasic (TRI-SPRINTEC) 0.18/0.215/0.25 MG-35 MCG tablet Take 1 tablet by mouth.     phentermine  15 MG capsule Take 1 capsule (15 mg total) by mouth every morning. (Patient not taking: Reported on 01/07/2024) 30 capsule 0   Phentermine -Topiramate (QSYMIA ) 3.75-23 MG CP24 Take 1 capsule by mouth in the morning. 30 capsule 0   No facility-administered medications prior to visit.    No Known Allergies  Review of Systems  Constitutional:  Negative for fever and malaise/fatigue.  HENT:  Negative for congestion.   Eyes:  Negative for blurred vision.  Respiratory:  Negative for shortness of breath.   Cardiovascular:  Negative for chest pain, palpitations and leg swelling.  Gastrointestinal:  Negative for abdominal pain, blood in stool and nausea.  Genitourinary:  Negative for dysuria and frequency.  Musculoskeletal:  Negative for falls.  Skin:  Negative for rash.  Neurological:  Negative for dizziness, loss of consciousness and headaches.  Endo/Heme/Allergies:  Negative for environmental allergies.  Psychiatric/Behavioral:  Negative for depression. The patient is not nervous/anxious.        Objective:    Physical Exam Vitals and nursing note reviewed.  Constitutional:      General: She is not in acute distress.    Appearance: Normal appearance. She is well-developed. She is obese.  HENT:     Head: Normocephalic and atraumatic.  Eyes:     General: No scleral icterus.       Right eye: No discharge.        Left eye: No discharge.  Cardiovascular:     Rate and Rhythm: Normal rate and regular  rhythm.     Heart sounds: No murmur heard. Pulmonary:     Effort: Pulmonary effort is normal. No respiratory distress.     Breath sounds: Normal breath sounds.  Musculoskeletal:        General: Normal range of motion.     Cervical back: Normal range of motion and neck supple.     Right lower leg: No edema.     Left lower leg: No edema.  Skin:    General: Skin is warm and dry.  Neurological:     Mental Status: She is alert and oriented to person, place, and time.  Psychiatric:        Mood and Affect: Mood normal.        Behavior: Behavior normal.        Thought Content: Thought content normal.        Judgment: Judgment normal.     BP 102/80 (BP Location: Left Arm, Patient Position: Sitting, Cuff Size: Normal)   Pulse 75   Temp 98.2 F (36.8 C) (Oral)   Resp 16   Ht 5' 1 (1.549 m)   Wt 200 lb 6.4 oz (90.9 kg)   SpO2 100%   BMI 37.87 kg/m  Wt Readings from Last 3 Encounters:  01/07/24 200 lb 6.4 oz (90.9 kg)  10/13/22 202 lb 6.4 oz (  91.8 kg)  08/19/21 198 lb 12.8 oz (90.2 kg)    Diabetic Foot Exam - Simple   No data filed    Lab Results  Component Value Date   WBC 5.5 10/13/2022   HGB 13.3 10/13/2022   HCT 39.6 10/13/2022   PLT 193.0 10/13/2022   GLUCOSE 84 10/13/2022   CHOL 148 10/13/2022   TRIG 85.0 10/13/2022   HDL 49.50 10/13/2022   LDLCALC 81 10/13/2022   ALT 12 10/13/2022   AST 17 10/13/2022   NA 137 10/13/2022   K 4.2 10/13/2022   CL 103 10/13/2022   CREATININE 0.87 10/13/2022   BUN 13 10/13/2022   CO2 26 10/13/2022   TSH 4.08 10/13/2022    Lab Results  Component Value Date   TSH 4.08 10/13/2022   Lab Results  Component Value Date   WBC 5.5 10/13/2022   HGB 13.3 10/13/2022   HCT 39.6 10/13/2022   MCV 93.4 10/13/2022   PLT 193.0 10/13/2022   Lab Results  Component Value Date   NA 137 10/13/2022   K 4.2 10/13/2022   CO2 26 10/13/2022   GLUCOSE 84 10/13/2022   BUN 13 10/13/2022   CREATININE 0.87 10/13/2022   BILITOT 0.6 10/13/2022    ALKPHOS 43 10/13/2022   AST 17 10/13/2022   ALT 12 10/13/2022   PROT 6.5 10/13/2022   ALBUMIN 3.8 10/13/2022   CALCIUM 9.3 10/13/2022   GFR 79.00 10/13/2022   Lab Results  Component Value Date   CHOL 148 10/13/2022   Lab Results  Component Value Date   HDL 49.50 10/13/2022   Lab Results  Component Value Date   LDLCALC 81 10/13/2022   Lab Results  Component Value Date   TRIG 85.0 10/13/2022   Lab Results  Component Value Date   CHOLHDL 3 10/13/2022   No results found for: HGBA1C     Assessment & Plan:  Morbid obesity (HCC) -     CBC with Differential/Platelet -     Comprehensive metabolic panel with GFR -     Lipid panel -     TSH -     Insulin , random -     Hemoglobin A1c -     Vitamin B12 -     VITAMIN D  25 Hydroxy (Vit-D Deficiency, Fractures) -     Tirzepatide -Weight Management; Inject 2.5 mg into the skin once a week.  Dispense: 2 mL; Refill: 0  Assessment and Plan Assessment & Plan Obesity   Obesity management includes considering weight loss medications. Although her insurance does not cover these, she is willing to pay out of pocket. Discussed the [employer] program for Zepbound , which offers a reduced price. Emphasized the importance of protein intake to support muscle mass and prevent 'Ozempic face'. Enroll in the [place] program for Zepbound  at a reduced cost and start Zepbound  at a 2.5 mg dose. Monitor weight loss and adjust the dose as needed. Ensure adequate protein intake. Notify after the third shot to determine next steps.  Uterine fibroids with dysmenorrhea   Uterine fibroids, last checked two years ago, are the size of a small orange. Dysmenorrhea with worsening cramps is present, and she is currently on her menstrual cycle. Discussed options for managing pain, including medications and lifestyle modifications. Consider an ultrasound to assess fibroid size if symptoms worsen. Use Aleve for pain management as needed and consider dual action Tylenol and  ibuprofen for pain relief. Utilize lifestyle modifications such as hot showers, a dark room, heating pad,  and relaxation techniques.    Jamon Hayhurst R Lowne Chase, DO

## 2024-01-08 ENCOUNTER — Ambulatory Visit: Payer: Self-pay | Admitting: Family Medicine

## 2024-01-08 LAB — LIPID PANEL
Cholesterol: 170 mg/dL (ref 0–200)
HDL: 53.8 mg/dL (ref >39.00)
LDL Cholesterol: 97 mg/dL (ref 0–99)
NonHDL: 116.45
Total CHOL/HDL Ratio: 3
Triglycerides: 98 mg/dL (ref 0.0–149.0)
VLDL: 19.6 mg/dL (ref 0.0–40.0)

## 2024-01-08 LAB — CBC WITH DIFFERENTIAL/PLATELET
Basophils Absolute: 0.1 K/uL (ref 0.0–0.1)
Basophils Relative: 1.1 % (ref 0.0–3.0)
Eosinophils Absolute: 0.1 K/uL (ref 0.0–0.7)
Eosinophils Relative: 1.3 % (ref 0.0–5.0)
HCT: 39.8 % (ref 36.0–46.0)
Hemoglobin: 13.3 g/dL (ref 12.0–15.0)
Lymphocytes Relative: 27.2 % (ref 12.0–46.0)
Lymphs Abs: 1.5 K/uL (ref 0.7–4.0)
MCHC: 33.5 g/dL (ref 30.0–36.0)
MCV: 93.8 fl (ref 78.0–100.0)
Monocytes Absolute: 0.4 K/uL (ref 0.1–1.0)
Monocytes Relative: 7.2 % (ref 3.0–12.0)
Neutro Abs: 3.5 K/uL (ref 1.4–7.7)
Neutrophils Relative %: 63.2 % (ref 43.0–77.0)
Platelets: 222 K/uL (ref 150.0–400.0)
RBC: 4.25 Mil/uL (ref 3.87–5.11)
RDW: 12.7 % (ref 11.5–15.5)
WBC: 5.5 K/uL (ref 4.0–10.5)

## 2024-01-08 LAB — COMPREHENSIVE METABOLIC PANEL WITH GFR
ALT: 10 U/L (ref 0–35)
AST: 14 U/L (ref 0–37)
Albumin: 4 g/dL (ref 3.5–5.2)
Alkaline Phosphatase: 38 U/L — ABNORMAL LOW (ref 39–117)
BUN: 10 mg/dL (ref 6–23)
CO2: 27 meq/L (ref 19–32)
Calcium: 9.1 mg/dL (ref 8.4–10.5)
Chloride: 102 meq/L (ref 96–112)
Creatinine, Ser: 1.15 mg/dL (ref 0.40–1.20)
GFR: 56.03 mL/min — ABNORMAL LOW (ref >60.00)
Glucose, Bld: 75 mg/dL (ref 70–99)
Potassium: 4 meq/L (ref 3.5–5.1)
Sodium: 137 meq/L (ref 135–145)
Total Bilirubin: 0.5 mg/dL (ref 0.2–1.2)
Total Protein: 6.9 g/dL (ref 6.0–8.3)

## 2024-01-08 LAB — VITAMIN D 25 HYDROXY (VIT D DEFICIENCY, FRACTURES): VITD: 11.62 ng/mL — ABNORMAL LOW (ref 30.00–100.00)

## 2024-01-08 LAB — HEMOGLOBIN A1C: Hgb A1c MFr Bld: 5.6 % (ref 4.6–6.5)

## 2024-01-08 LAB — INSULIN, RANDOM: Insulin: 22.3 u[IU]/mL — ABNORMAL HIGH

## 2024-01-08 LAB — VITAMIN B12: Vitamin B-12: 236 pg/mL (ref 211–911)

## 2024-01-08 LAB — TSH: TSH: 3.72 u[IU]/mL (ref 0.35–5.50)

## 2024-01-09 ENCOUNTER — Other Ambulatory Visit (HOSPITAL_COMMUNITY): Payer: Self-pay

## 2024-01-09 ENCOUNTER — Telehealth: Payer: Self-pay

## 2024-01-09 NOTE — Telephone Encounter (Signed)
 Pharmacy Patient Advocate Encounter   Received notification from Onbase that prior authorization for ZEPBOUND  is required/requested.   Insurance verification completed.   The patient is insured through [facility] .   Per test claim:  PLAN EXCLUSION. WEIGHT LOSS DRUGS NOT COVERED

## 2024-01-10 ENCOUNTER — Other Ambulatory Visit: Payer: Self-pay

## 2024-01-10 MED ORDER — VITAMIN D (ERGOCALCIFEROL) 1.25 MG (50000 UNIT) PO CAPS: 50000.0000 [IU] | ORAL_CAPSULE | ORAL | 1 refills | Status: AC

## 2024-01-24 ENCOUNTER — Other Ambulatory Visit: Payer: Self-pay | Admitting: Family Medicine

## 2024-01-29 ENCOUNTER — Other Ambulatory Visit: Payer: Self-pay | Admitting: Family Medicine

## 2024-01-29 MED ORDER — TIRZEPATIDE-WEIGHT MANAGEMENT 5 MG/0.5ML ~~LOC~~ SOLN: 5.0000 mg | SUBCUTANEOUS | 1 refills | Status: DC

## 2024-01-29 MED ORDER — TIRZEPATIDE 5 MG/0.5ML ~~LOC~~ SOAJ: 5.0000 mg | SUBCUTANEOUS | 1 refills | Status: DC

## 2024-02-05 ENCOUNTER — Other Ambulatory Visit: Payer: Self-pay | Admitting: Family Medicine

## 2024-02-06 ENCOUNTER — Other Ambulatory Visit (HOSPITAL_COMMUNITY): Payer: Self-pay

## 2024-02-06 ENCOUNTER — Telehealth: Payer: Self-pay

## 2024-02-06 NOTE — Telephone Encounter (Signed)
 Ozempic Brena is approved exclusively as an adjunct to diet and exercise to improve glycemic control in adults with type 2 diabetes mellitus. A review of patient's medical chart reveals no documented diagnosis of type 2 diabetes or an A1C indicative of diabetes. Therefore, they do not currently meet the criteria for prior authorization of this medication. If clinically appropriate, alternative  options such as Saxenda, Zepbound, or Wegovy  may be considered for this patient.

## 2024-02-21 ENCOUNTER — Encounter: Payer: Self-pay | Admitting: Family Medicine

## 2024-03-24 ENCOUNTER — Other Ambulatory Visit: Payer: Self-pay | Admitting: Family Medicine

## 2024-03-24 ENCOUNTER — Encounter: Payer: Self-pay | Admitting: Family Medicine

## 2024-03-24 MED ORDER — TIRZEPATIDE-WEIGHT MANAGEMENT 7.5 MG/0.5ML ~~LOC~~ SOLN: 7.5000 mg | SUBCUTANEOUS | 2 refills | Status: DC

## 2024-04-08 ENCOUNTER — Telehealth: Payer: Self-pay | Admitting: Family Medicine

## 2024-04-08 NOTE — Telephone Encounter (Signed)
**Note De-identified  [place] Obfuscation** Please advise 

## 2024-04-08 NOTE — Telephone Encounter (Signed)
 If pt is taking the Mounjaro  she does need to keep her appointment

## 2024-04-08 NOTE — Telephone Encounter (Signed)
 Okay, I let her know.

## 2024-04-08 NOTE — Telephone Encounter (Signed)
 Pt has an appt. On 11/17 that was scheduled when she checked out at her last appt on 01/07/24. She thinks this is for a med refill but is not sure. She would like to know if she needs this appt.

## 2024-04-11 ENCOUNTER — Ambulatory Visit: Admitting: Family Medicine

## 2024-04-14 ENCOUNTER — Encounter: Payer: Self-pay | Admitting: Family Medicine

## 2024-04-14 ENCOUNTER — Ambulatory Visit: Admitting: Family Medicine

## 2024-04-14 NOTE — Assessment & Plan Note (Addendum)
 Ghm utd Check labs  See AVS Health Maintenance  Topic Date Due   HIV Screening  Never done   Hepatitis B Vaccines 19-59 Average Risk (1 of 3 - 19+ 3-dose series) Never done   Cervical Cancer Screening (HPV/Pap Cotest)  Never done   Influenza Vaccine  12/28/2023   COVID-19 Vaccine (3 - 2025-26 season) 01/28/2024   Mammogram  10/14/2024   DTaP/Tdap/Td (2 - Td or Tdap) 06/19/2027   Colonoscopy  09/18/2031   Hepatitis C Screening  Completed   Pneumococcal Vaccine  Aged Out   HPV VACCINES  Aged Out   Meningococcal B Vaccine  Aged Out   Con't zepbound 

## 2024-04-14 NOTE — Progress Notes (Signed)
 Subjective:    Patient ID: MAYDELIN DEMING, female    DOB: May 27, 1976, 48 y.o.   MRN: [id number]  Chief Complaint  Patient presents with   Weight Check    HPI Patient is in today for f/u weight.  Discussed the use of AI scribe software for clinical note transcription with the patient, who gave verbal consent to proceed.  History of Present Illness Appollonia Klee Boesen is a 48 year old female who presents for follow-up regarding weight management.  She has experienced a weight loss of 18 pounds, decreasing from 202 pounds in May to 183 pounds currently. She started her current medication regimen 13 weeks ago. She is currently on her second dose of 7.5 mg, having taken the second dose last night.  Her social life has increased, leading to alcohol consumption twice a week, although her cravings have decreased. She consumes yogurt, salmon, oatmeal, and chicken, but acknowledges the need to increase her protein intake. She engages in physical activity, performing O2 exercises four days a week in the morning.  She uses Metamucil, either in pill form or as a drink, to manage constipation, noting that the drink works better for her than the pills.  She is self-employed and pays for her medication out-of-pocket, as her insurance does not cover it.  No side effects from her current medication.    No past medical history on file.  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   Occupation:  Conservation Officer, Historic Buildings: PRODUCTION ASSISTANT, [employer]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    Employer: [employer]  Tobacco Use   Smoking status: Never   Smokeless tobacco: Never  Substance and Sexual Activity   Alcohol use: Yes    Alcohol/week: 2.0 standard drinks of alcohol    Types: 2 Standard drinks or equivalent per week   Drug use: Never   Sexual activity: Yes    Partners: Male    Birth control/protection: Pill  Other Topics Concern   Not on file  Social History Narrative   Exercise -- 3 days a week    Social Drivers of Corporate Investment Banker Strain: Not on file  Food Insecurity: Not on file  Transportation Needs: No Transportation Needs (01/04/2024)   PRAPARE - Administrator, Civil Service (Medical): No    Lack of Transportation (Non-Medical): No  Physical Activity: Sufficiently Active (01/04/2024)   Exercise Vital Sign    Days of Exercise per Week: 4 days    Minutes of Exercise per Session: 90 min  Stress: Not on file  Social Connections: Not on file  Intimate Partner Violence: Not on file    Outpatient Medications Prior to Visit  Medication Sig Dispense Refill   Norgestimate-Ethinyl Estradiol Triphasic (TRI-SPRINTEC) 0.18/0.215/0.25 MG-35 MCG tablet Take 1 tablet by mouth.     tirzepatide  7.5 MG/0.5ML injection vial Inject 7.5 mg into the skin once a week. 6 mL 2  Vitamin D , Ergocalciferol , (DRISDOL ) 1.25 MG (50000 UNIT) CAPS capsule Take 1 capsule (50,000 Units total) by mouth every 7 (seven) days. 12 capsule 1   tirzepatide  (MOUNJARO ) 5 MG/0.5ML Pen Inject 5 mg into the skin once a week. 6 mL 1   No facility-administered medications prior to visit.    No Known Allergies  Review of Systems  Constitutional:  Negative for fever and malaise/fatigue.  HENT:  Negative for congestion.   Eyes:  Negative for blurred vision.  Respiratory:  Negative for shortness of breath.   Cardiovascular:  Negative for chest pain, palpitations and leg swelling.  Gastrointestinal:   Negative for abdominal pain, blood in stool and nausea.  Genitourinary:  Negative for dysuria and frequency.  Musculoskeletal:  Negative for falls.  Skin:  Negative for rash.  Neurological:  Negative for dizziness, loss of consciousness and headaches.  Endo/Heme/Allergies:  Negative for environmental allergies.  Psychiatric/Behavioral:  Negative for depression. The patient is not nervous/anxious.        Objective:    Physical Exam Vitals and nursing note reviewed.  Constitutional:      General: She is not in acute distress.    Appearance: Normal appearance. She is well-developed.  HENT:     Head: Normocephalic and atraumatic.  Eyes:     General: No scleral icterus.       Right eye: No discharge.        Left eye: No discharge.  Cardiovascular:     Rate and Rhythm: Normal rate and regular rhythm.     Heart sounds: No murmur heard. Pulmonary:     Effort: Pulmonary effort is normal. No respiratory distress.     Breath sounds: Normal breath sounds.  Musculoskeletal:        General: Normal range of motion.     Cervical back: Normal range of motion and neck supple.     Right lower leg: No edema.     Left lower leg: No edema.  Skin:    General: Skin is warm and dry.  Neurological:     Mental Status: She is alert and oriented to person, place, and time.  Psychiatric:        Mood and Affect: Mood normal.        Behavior: Behavior normal.        Thought Content: Thought content normal.        Judgment: Judgment normal.     BP 114/60 (BP Location: Left Arm, Patient Position: Sitting, Cuff Size: Large)   Pulse 69   Temp 98 F (36.7 C) (Oral)   Resp 16   Ht 5' 1 (1.549 m)   Wt 183 lb 12.8 oz (83.4 kg)   SpO2 96%   BMI 34.73 kg/m  Wt Readings from Last 3 Encounters:  04/14/24 183 lb 12.8 oz (83.4 kg)  01/07/24 200 lb 6.4 oz (90.9 kg)  10/13/22 202 lb 6.4 oz (91.8 kg)    Diabetic Foot Exam - Simple   No data filed    Lab Results  Component Value Date   WBC 5.5  01/07/2024   HGB 13.3 01/07/2024   HCT 39.8 01/07/2024   PLT 222.0 01/07/2024   GLUCOSE 75 01/07/2024   CHOL 170 01/07/2024   TRIG 98.0 01/07/2024   HDL 53.80 01/07/2024   LDLCALC 97 01/07/2024   ALT 10 01/07/2024   AST 14 01/07/2024   NA 137 01/07/2024   K 4.0 01/07/2024   CL 102 01/07/2024   CREATININE 1.15 01/07/2024  BUN 10 01/07/2024   CO2 27 01/07/2024   TSH 3.72 01/07/2024   HGBA1C 5.6 01/07/2024    Lab Results  Component Value Date   TSH 3.72 01/07/2024   Lab Results  Component Value Date   WBC 5.5 01/07/2024   HGB 13.3 01/07/2024   HCT 39.8 01/07/2024   MCV 93.8 01/07/2024   PLT 222.0 01/07/2024   Lab Results  Component Value Date   NA 137 01/07/2024   K 4.0 01/07/2024   CO2 27 01/07/2024   GLUCOSE 75 01/07/2024   BUN 10 01/07/2024   CREATININE 1.15 01/07/2024   BILITOT 0.5 01/07/2024   ALKPHOS 38 (L) 01/07/2024   AST 14 01/07/2024   ALT 10 01/07/2024   PROT 6.9 01/07/2024   ALBUMIN 4.0 01/07/2024   CALCIUM 9.1 01/07/2024   GFR 56.03 (L) 01/07/2024   Lab Results  Component Value Date   CHOL 170 01/07/2024   Lab Results  Component Value Date   HDL 53.80 01/07/2024   Lab Results  Component Value Date   LDLCALC 97 01/07/2024   Lab Results  Component Value Date   TRIG 98.0 01/07/2024   Lab Results  Component Value Date   CHOLHDL 3 01/07/2024   Lab Results  Component Value Date   HGBA1C 5.6 01/07/2024       Assessment & Plan:  Morbid obesity (HCC) Assessment & Plan: Ghm utd Check labs  See AVS Health Maintenance  Topic Date Due   HIV Screening  Never done   Hepatitis B Vaccines 19-59 Average Risk (1 of 3 - 19+ 3-dose series) Never done   Cervical Cancer Screening (HPV/Pap Cotest)  Never done   Influenza Vaccine  12/28/2023   COVID-19 Vaccine (3 - 2025-26 season) 01/28/2024   Mammogram  10/14/2024   DTaP/Tdap/Td (2 - Td or Tdap) 06/19/2027   Colonoscopy  09/18/2031   Hepatitis C Screening  Completed   Pneumococcal  Vaccine  Aged Out   HPV VACCINES  Aged Out   Meningococcal B Vaccine  Aged Out   Con't zepbound      Jamee JONELLE Antonio Cyndee, DO

## 2024-05-13 ENCOUNTER — Encounter: Payer: Self-pay | Admitting: Family Medicine

## 2024-05-13 MED ORDER — TIRZEPATIDE-WEIGHT MANAGEMENT 10 MG/0.5ML ~~LOC~~ SOLN: 10.0000 mg | SUBCUTANEOUS | 1 refills | Status: AC

## 2024-06-18 ENCOUNTER — Other Ambulatory Visit (HOSPITAL_COMMUNITY): Payer: Self-pay

## 2024-10-23 ENCOUNTER — Encounter: Admitting: Family Medicine
